# Patient Record
Sex: Female | Born: 1970
Health system: Southern US, Community
[De-identification: ages and names within clinical notes are randomized; demographics above are authoritative.]

## PROBLEM LIST (undated history)

## (undated) DIAGNOSIS — I10 Essential (primary) hypertension: Secondary | ICD-10-CM

## (undated) HISTORY — PX: OOPHORECTOMY: SHX86

## (undated) HISTORY — DX: Essential (primary) hypertension: I10

---

## 2008-05-10 ENCOUNTER — Encounter: Payer: Self-pay | Admitting: Cardiology

## 2008-05-10 ENCOUNTER — Ambulatory Visit: Payer: Self-pay

## 2012-07-04 ENCOUNTER — Encounter (HOSPITAL_COMMUNITY): Payer: Self-pay | Admitting: Emergency Medicine

## 2012-07-04 ENCOUNTER — Emergency Department (HOSPITAL_COMMUNITY)
Admission: EM | Admit: 2012-07-04 | Discharge: 2012-07-05 | Disposition: A | Discharge status: Home / Self Care | Payer: PRIVATE HEALTH INSURANCE | Attending: Emergency Medicine | Admitting: Emergency Medicine

## 2012-07-04 DIAGNOSIS — S0502XA Injury of conjunctiva and corneal abrasion without foreign body, left eye, initial encounter: Secondary | ICD-10-CM

## 2012-07-04 DIAGNOSIS — Y9389 Activity, other specified: Secondary | ICD-10-CM | POA: Insufficient documentation

## 2012-07-04 DIAGNOSIS — S058X9A Other injuries of unspecified eye and orbit, initial encounter: Secondary | ICD-10-CM | POA: Insufficient documentation

## 2012-07-04 DIAGNOSIS — H538 Other visual disturbances: Secondary | ICD-10-CM | POA: Insufficient documentation

## 2012-07-04 DIAGNOSIS — IMO0002 Reserved for concepts with insufficient information to code with codable children: Secondary | ICD-10-CM | POA: Insufficient documentation

## 2012-07-04 DIAGNOSIS — Y9289 Other specified places as the place of occurrence of the external cause: Secondary | ICD-10-CM | POA: Insufficient documentation

## 2012-07-04 DIAGNOSIS — Y990 Civilian activity done for income or pay: Secondary | ICD-10-CM | POA: Insufficient documentation

## 2012-07-04 MED ORDER — TETRACAINE HCL 0.5 % OP SOLN
2.0000 [drp] | Freq: Once | OPHTHALMIC | Status: AC
  Administered 2012-07-04: 2 [drp] via OPHTHALMIC
  Filled 2012-07-04: qty 2

## 2012-07-04 MED ORDER — CYCLOPENTOLATE HCL 1 % OP SOLN
1.0000 [drp] | Freq: Once | OPHTHALMIC | Status: AC
  Administered 2012-07-04: 1 [drp] via OPHTHALMIC

## 2012-07-04 MED ORDER — HYDROCODONE-ACETAMINOPHEN 5-325 MG PO TABS: 1.0000 | ORAL_TABLET | Freq: Four times a day (QID) | ORAL | Status: DC | PRN

## 2012-07-04 MED ORDER — TOBRAMYCIN-DEXAMETHASONE 0.3-0.1 % OP SUSP
2.0000 [drp] | OPHTHALMIC | Status: DC
  Administered 2012-07-04: 2 [drp] via OPHTHALMIC
  Filled 2012-07-04: qty 2.5

## 2012-07-04 NOTE — ED Provider Notes (Signed)
History    This chart was scribed for non-physician practitioner Lottie Mussel, PA-C working with Richardean Canal, MD by Toya Smothers, ED Scribe. This patient was seen in room WTR7/WTR7 and the patient's care was started at 11:20 PM.   CSN: [id number]  Arrival date & time 07/04/12  2255   First MD Initiated Contact with Patient 07/04/12 2312      Chief Complaint  Patient presents with  . Eye Pain   Patient is a 42 y.o. female presenting with eye pain. The history is provided by the patient. No language interpreter was used.  Eye Pain    HPI Comments: Kaitlyn Bennett is a 42 y.o. female who presents to the Emergency Department complaining of left eye pain. Pt works as an Char[employer] fundraiser, and while working she was poked in the eye by a patient's finger. Pain is moderate, dull. Pt now endorses blurred vision. She states that she is concerned because infection because the patient she was "diggin around in his orifices." Symptoms have not been treated PTA. Pt denies headache, diaphoresis, fever, chills, nausea, vomiting, diarrhea, weakness, cough, SOB and any other pain. Pt denies use of tobacco, alcohol, and illicit drug use. Tetanus is up to date. Pt does not wear contacts or has an eye specialist.     History reviewed. No pertinent past medical history.  Past Surgical History  Procedure Laterality Date  . Cesarean section      No family history on file.  History  Substance Use Topics  . Smoking status: Not on file  . Smokeless tobacco: Not on file  . Alcohol Use: Not on file    Review of Systems  Eyes: Positive for pain and visual disturbance. Negative for discharge, redness and itching.    Allergies  Review of patient's allergies indicates not on file.  Home Medications  No current outpatient prescriptions on file.  BP 128/87  Pulse 88  Temp(Src) 98.4 F (36.9 C) (Oral)  Resp 20  SpO2 97%  Physical Exam  Nursing note and vitals reviewed. Constitutional: She is oriented  to person, place, and time. She appears well-developed and well-nourished. No distress.  HENT:  Head: Normocephalic and atraumatic.  Eyes: EOM and lids are normal. Pupils are equal, round, and reactive to light. Left eye exhibits no discharge and no hordeolum. Right conjunctiva is not injected. Left conjunctiva is injected. Left conjunctiva has no hemorrhage.  Slit lamp exam:      The right eye shows no hyphema and no hypopyon.       The left eye shows corneal abrasion. The left eye shows no foreign body, no hyphema and no hypopyon.    Neck: Neck supple. No tracheal deviation present.  Cardiovascular: Normal rate.   Pulmonary/Chest: Effort normal. No respiratory distress.  Musculoskeletal: Normal range of motion. She exhibits no edema.  Neurological: She is alert and oriented to person, place, and time. No sensory deficit.  Skin: Skin is warm and dry.  Psychiatric: She has a normal mood and affect. Her behavior is normal.    ED Course  Procedures  DIAGNOSTIC STUDIES: Oxygen Saturation is 97% on room air, normal by my interpretation.    COORDINATION OF CARE: 23:12- Evaluated Pt. Pt is awake, alert, and without distress. 23:20- Patient understand and agree with initial ED impression and plan with expectations set for ED visit.   Labs Reviewed - No data to display No results found.   1. Corneal abrasion, left, initial encounter  MDM  Pt with left corneal abrasion. Will treat with tobradex and cyclogyl at home. Follow up with ophthalmologist. Pt's tetanus is up to date. No other exam findings. Pt states vision is normal after tetracaine drops. Pain medications at home, norco, and referral to ophthalmology given.   Filed Vitals:   07/04/12 2259  BP: 128/87  Pulse: 88  Temp: 98.4 F (36.9 C)  TempSrc: Oral  Resp: 20  SpO2: 97%     I personally performed the services described in this documentation, which was scribed in my presence. The recorded information has been  reviewed and is accurate.    Lottie Mussel, PA-C 07/05/12 0101

## 2012-07-04 NOTE — ED Notes (Signed)
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  Eye is reddened.  Patient reports vision is a little blurred.

## 2012-07-05 NOTE — ED Provider Notes (Signed)
Medical screening examination/treatment/procedure(s) were performed by non-physician practitioner and as supervising physician I was immediately available for consultation/collaboration.  Keyah Blizard R. Joshua Zeringue, MD 07/05/12 0746 

## 2015-04-03 ENCOUNTER — Ambulatory Visit (INDEPENDENT_AMBULATORY_CARE_PROVIDER_SITE_OTHER): Payer: 59 | Admitting: Physician Assistant

## 2015-04-03 ENCOUNTER — Ambulatory Visit (INDEPENDENT_AMBULATORY_CARE_PROVIDER_SITE_OTHER): Payer: 59

## 2015-04-03 VITALS — BP 120/86 | HR 83 | Temp 97.6°F | Resp 20 | Ht 59.0 in | Wt 145.0 lb

## 2015-04-03 DIAGNOSIS — R0981 Nasal congestion: Secondary | ICD-10-CM | POA: Diagnosis not present

## 2015-04-03 DIAGNOSIS — R05 Cough: Secondary | ICD-10-CM

## 2015-04-03 DIAGNOSIS — R062 Wheezing: Secondary | ICD-10-CM | POA: Diagnosis not present

## 2015-04-03 DIAGNOSIS — R059 Cough, unspecified: Secondary | ICD-10-CM

## 2015-04-03 MED ORDER — IPRATROPIUM BROMIDE 0.02 % IN SOLN
0.5000 mg | Freq: Once | RESPIRATORY_TRACT | Status: AC
  Administered 2015-04-03: 0.5 mg via RESPIRATORY_TRACT

## 2015-04-03 MED ORDER — HYDROCOD POLST-CPM POLST ER 10-8 MG/5ML PO SUER: 5.0000 mL | Freq: Two times a day (BID) | ORAL | Status: DC | PRN

## 2015-04-03 MED ORDER — PREDNISONE 20 MG PO TABS: ORAL_TABLET | ORAL | Status: DC

## 2015-04-03 MED ORDER — ALBUTEROL SULFATE HFA 108 (90 BASE) MCG/ACT IN AERS: 2.0000 | INHALATION_SPRAY | RESPIRATORY_TRACT | Status: DC | PRN

## 2015-04-03 MED ORDER — ALBUTEROL SULFATE (2.5 MG/3ML) 0.083% IN NEBU
2.5000 mg | INHALATION_SOLUTION | Freq: Once | RESPIRATORY_TRACT | Status: AC
  Administered 2015-04-03: 2.5 mg via RESPIRATORY_TRACT

## 2015-04-03 NOTE — Progress Notes (Signed)
Subjective:    Patient ID: Kaitlyn Bennett, female    DOB: [birth year], 45 y.o.   MRN: [id number]  Chief Complaint  Patient presents with  . Cough    x 1 week  . Wheezing  . Nasal Congestion   HPI  Kaitlyn Bennett is a 45 year old woman who presents today with a cough for about 1 week. Reports that last weekend she developed a dry cough, some congestion, body aches, and fever. 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 Dr. Purcell Mouton from [facility] prescribed her Augmentin. She is on day 6 with no improvement in cough or congestion. Body aches and fever have resolved. Cough is non productive but she reports feeling like "there is something stuck down there that she can't get out." States the cough makes her chest tight and is interfering with sleep. Associated with rhinorrhea, congestion, PND, and headache. Started wheezing 3 days ago and feels like she's having lung spasms. She denies sore throat, SOB, and CP. She takes dessolm during the day at work to suppress the cough and then mucionex at night to try to get the mucous out.    Is concerned because her children now have cough. She wants xray, flu swab, and maybe to change to levaquin for better bacterial coverage.   PMH, FH, and SH all reviewed with patient and updated.   No Known Allergies Prior to Admission medications   Medication Sig Start Date End Date Taking? Authorizing Provider  amoxicillin-clavulanate (AUGMENTIN) 875-125 MG tablet Take 1 tablet by mouth 2 (two) times daily.   Yes Historical Provider, MD  B Complex Vitamins (B-COMPLEX/B-12) TABS Take by mouth.   Yes Historical Provider, MD  Calcium Carbonate-Vit D-Min (CALTRATE PLUS PO) Take by mouth.   Yes Historical Provider, MD  Vitamin D, Ergocalciferol, (DRISDOL) 50000 units CAPS capsule Take 50,000 Units by mouth every 7 (seven) days.   Yes Historical Provider, MD   Review of Systems  Constitutional: Positive for  fever and chills. Negative for fatigue.  HENT: Positive for congestion, postnasal drip, rhinorrhea and sinus pressure. Negative for sore throat.   Respiratory: Positive for cough, chest tightness and wheezing. Negative for shortness of breath.   Cardiovascular: Negative for chest pain.  Gastrointestinal: Negative for nausea, vomiting, abdominal pain, diarrhea and constipation.  Neurological: Positive for headaches. Negative for dizziness and light-headedness.      Objective:   Physical Exam  Constitutional: She is oriented to person, place, and time. She appears well-developed and well-nourished.  Blood pressure 120/86, pulse 83, temperature 97.6 F (36.4 C), temperature source Oral, resp. rate 20, height  (1.499 m), weight 145 lb (65.772 kg), last menstrual period 03/23/2015, SpO2 97 %.   HENT:  Head: Normocephalic and atraumatic.  Right Ear: Hearing, tympanic membrane, external ear and ear canal normal.  Left Ear: Hearing, tympanic membrane, external ear and ear canal normal.  Nose: Rhinorrhea present.  Mouth/Throat: Oropharynx is clear and moist and mucous membranes are normal. No oropharyngeal exudate or posterior oropharyngeal edema.  Eyes: Conjunctivae and EOM are normal. Pupils are equal, round, and reactive to light.  Neck: Normal range of motion. Neck supple.  Cardiovascular: Normal rate, regular rhythm, S1 normal, S2 normal and intact distal pulses.  Exam reveals no gallop and no friction rub.   No murmur heard. Pulmonary/Chest: She has no wheezes. She has no rhonchi. She has no rales.  Negative eegophony.  Lymphadenopathy:       Head (right side): No submental, no submandibular, no tonsillar, no preauricular, no posterior auricular and no occipital adenopathy present.       Head (left side): No submental, no submandibular, no tonsillar, no preauricular, no posterior auricular and no occipital adenopathy present.       Right: No supraclavicular adenopathy present.        Left: No supraclavicular adenopathy present.  Neurological: She is alert and oriented to person, place, and time. She has normal strength. No sensory deficit.  Skin: Skin is warm and dry. She is not diaphoretic.  Psychiatric: She has a normal mood and affect. Her behavior is normal. Judgment and thought content normal.   Dg Chest 2 View  04/03/2015  CLINICAL DATA:  45 year old female with cough for 1 week. EXAM: CHEST  2 VIEW COMPARISON:  None. FINDINGS: The cardiomediastinal silhouette is unremarkable. There is no evidence of focal airspace disease, pulmonary edema, suspicious pulmonary nodule/mass, pleural effusion, or pneumothorax. No acute bony abnormalities are identified. IMPRESSION: No active cardiopulmonary disease. Electronically Signed   By: Harmon Pier M.D.   On: 04/03/2015 09:57      Assessment & Plan:   1. Cough Cough is most likely due to PND and bronchial irritation. CXR is clear and indicates no acute infection. Due to the continued nature off her URI will prescribe prednisone to help resolve the inflammation. Will also provide albuterol inhaler to help with wheezing and spasms. Will provide tussionex to help with cough. Patient encouraged to get rest and drink lots of fluids. Instructed to return if symptoms get worse or don't improve. Discussed medications and possible side effects with patient. Patient expressed understanding and agreement with assessment and plan.  - albuterol (PROVENTIL) (2.5 MG/3ML) 0.083% nebulizer solution 2.5 mg; Take 3 mLs (2.5 mg total) by nebulization once. - ipratropium (ATROVENT) nebulizer solution 0.5 mg; Take 2.5 mLs (0.5 mg total) by nebulization once. - DG Chest 2 View; Future - chlorpheniramine-HYDROcodone (TUSSIONEX PENNKINETIC ER) 10-8 MG/5ML SUER; Take 5 mLs by mouth every 12 (twelve) hours as needed for cough.  Dispense: 100 mL; Refill: 0 - albuterol (PROVENTIL HFA;VENTOLIN HFA) 108 (90 Base) MCG/ACT inhaler; Inhale 2 puffs into the lungs  every 4 (four) hours as needed for wheezing or shortness of breath (cough, shortness of breath or wheezing.).  Dispense: 1 Inhaler; Refill: 0 - predniSONE (DELTASONE) 20 MG tablet; Take 3 PO QAM x3days, 2 PO QAM x3days, 1 PO QAM x3days  Dispense: 18 tablet; Refill: 0  S. Lysle Dingwall PA-S [facility]

## 2015-04-03 NOTE — Patient Instructions (Signed)
Use OTC Mucinex to help thin the mucous, make it easier to cough up and spit out, easier to blow it from your nose.  Get plenty of rest and drink at least 64 ounces of water daily.

## 2015-04-03 NOTE — Progress Notes (Signed)
Subjective:   Patient ID: Kaitlyn Bennett, female     DOB: [birth year], 45 y.o.    MRN: [id number]  PCP: No primary care provider on file.  Chief Complaint  Patient presents with  . Cough    x 1 week  . Wheezing  . Nasal Congestion    HPI  Presents for evaluation of a cough for about 1 week.   Reports that last weekend she developed a dry cough, some congestion, body aches, and fever. 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 Dr. Purcell Mouton from [facility] prescribed her Augmentin. She is on day 6 with no improvement in cough or congestion. Body aches and fever have resolved. Cough is non productive but she reports feeling like "there is something stuck down there that she can't get out." States the cough makes her chest tight and is interfering with sleep.   Associated with rhinorrhea, congestion, PND, and headache. Started wheezing 3 days ago and feels like she's having lung spasms. She denies sore throat, SOB, and CP. She takes Delsym during the day at work to suppress the cough and then mucinex at night to try to get the mucous out.   Is concerned because her children now have cough. She wants xray, flu swab, and maybe to change to levaquin for better bacterial coverage. .    Prior to Admission medications   Medication Sig Start Date End Date Taking? Authorizing Provider  amoxicillin-clavulanate (AUGMENTIN) 875-125 MG tablet Take 1 tablet by mouth 2 (two) times daily.   Yes Historical Provider, MD  B Complex Vitamins (B-COMPLEX/B-12) TABS Take by mouth.   Yes Historical Provider, MD  Calcium Carbonate-Vit D-Min (CALTRATE PLUS PO) Take by mouth.   Yes Historical Provider, MD  Vitamin D, Ergocalciferol, (DRISDOL) 50000 units CAPS capsule Take 50,000 Units by mouth every 7 (seven) days.   Yes Historical Provider, MD     No Known Allergies   There are no active problems to display for this patient.    Family  History  Problem Relation Age of Onset  . Hypertension Father      Social History   Social History  . Marital Status: Married    Spouse Name: [name]  . Number of Children: 2  . Years of Education: Nursing   Occupational History  . Nurse [employer]  . Nurse [employer]   Social History Main Topics  . Smoking status: Never Smoker   . Smokeless tobacco: Never Used  . Alcohol Use: No  . Drug Use: No  . Sexual Activity:    Partners: Male    Birth Control/ Protection: None   Other Topics Concern  . Not on file   Social History Narrative   Lives with husband, [name] and two children.         Review of Systems Constitutional: Positive for fever and chills. Negative for fatigue.  HENT: Positive for congestion, postnasal drip, rhinorrhea and sinus pressure. Negative for sore throat.  Respiratory: Positive for cough, chest tightness and wheezing. Negative for shortness of breath.  Cardiovascular: Negative for chest pain.  Gastrointestinal: Negative for nausea, vomiting, abdominal pain, diarrhea and constipation.  Neurological: Positive for headaches. Negative for dizziness and light-headedness.       Objective:  Physical Exam  Constitutional: She is oriented to person, place, and time. She appears well-developed and well-nourished. She is active and cooperative. No distress.  BP 120/86 mmHg  Pulse 83  Temp(Src) 97.6 F (36.4 C) (Oral)  Resp 20  Ht  (1.499 m)  Wt 145 lb (65.772 kg)  BMI 29.27 kg/m2  SpO2 97%  LMP 03/23/2015 Coughs repeatedly during interview and exam   HENT:  Head: Normocephalic and atraumatic.  Right Ear: Hearing, tympanic membrane, external ear and ear canal normal.  Left Ear: Hearing, tympanic membrane, external ear and ear canal normal.  Nose: Mucosal edema and rhinorrhea present.  No foreign bodies. Right sinus exhibits no maxillary sinus tenderness and no frontal sinus tenderness. Left  sinus exhibits no maxillary sinus tenderness and no frontal sinus tenderness.  Mouth/Throat: Uvula is midline, oropharynx is clear and moist and mucous membranes are normal. No uvula swelling. No oropharyngeal exudate.  Eyes: Conjunctivae and EOM are normal. Pupils are equal, round, and reactive to light. Right eye exhibits no discharge. Left eye exhibits no discharge. No scleral icterus.  Neck: Trachea normal, normal range of motion, full passive range of motion without pain and phonation normal. Neck supple. No thyroid mass and no thyromegaly present.  Cardiovascular: Normal rate, regular rhythm, normal heart sounds and intact distal pulses.   Pulmonary/Chest: Effort normal and breath sounds normal.  Lymphadenopathy:       Head (right side): No submandibular, no tonsillar, no preauricular, no posterior auricular and no occipital adenopathy present.       Head (left side): No submandibular, no tonsillar, no preauricular and no occipital adenopathy present.    She has no cervical adenopathy.       Right: No supraclavicular adenopathy present.       Left: No supraclavicular adenopathy present.  Neurological: She is alert and oriented to person, place, and time. She has normal strength. No cranial nerve deficit or sensory deficit.  Skin: Skin is warm, dry and intact. No rash noted.  Psychiatric: She has a normal mood and affect. Her speech is normal and behavior is normal.   Albuterol + Atrovent neb treatment resulted in some modest improvement in cough frequency and chest tightness.  Dg Chest 2 View  04/03/2015  CLINICAL DATA:  45 year old female with cough for 1 week. EXAM: CHEST  2 VIEW COMPARISON:  None. FINDINGS: The cardiomediastinal silhouette is unremarkable. There is no evidence of focal airspace disease, pulmonary edema, suspicious pulmonary nodule/mass, pleural effusion, or pneumothorax. No acute bony abnormalities are identified. IMPRESSION: No active cardiopulmonary disease.  Electronically Signed   By: Harmon Pier M.D.   On: 04/03/2015 09:57             Assessment & Plan:  1. Cough I don't think she has a bacterial infection. She probably did have the flu initially, and the persistent nasal/sinus drainage and bronchospasm are the cause of the cough. Some improvement with the neb in the office. Prednisone taper, Tussionex, albuterol inhaler. Rest. Fluids. OTC Mucinex. - albuterol (PROVENTIL) (2.5 MG/3ML) 0.083% nebulizer solution 2.5 mg; Take 3 mLs (2.5 mg total) by nebulization once. - ipratropium (ATROVENT) nebulizer solution 0.5 mg; Take 2.5 mLs (0.5 mg total) by nebulization once. - DG Chest 2 View; Future - chlorpheniramine-HYDROcodone (TUSSIONEX PENNKINETIC ER) 10-8 MG/5ML SUER; Take 5 mLs by mouth every 12 (twelve) hours as needed for cough.  Dispense: 100 mL; Refill: 0 - albuterol (PROVENTIL HFA;VENTOLIN HFA) 108 (90 Base) MCG/ACT inhaler; Inhale 2 puffs into the lungs every 4 (four) hours as needed for wheezing or shortness of breath (cough, shortness of breath or wheezing.).  Dispense: 1 Inhaler; Refill: 0 -  predniSONE (DELTASONE) 20 MG tablet; Take 3 PO QAM x3days, 2 PO QAM x3days, 1 PO QAM x3days  Dispense: 18 tablet; Refill: 0   Fernande Brashelle S. Emir Nack, PA-C Physician Assistant-Certified [facility]

## 2015-04-21 DIAGNOSIS — Z1231 Encounter for screening mammogram for malignant neoplasm of breast: Secondary | ICD-10-CM | POA: Diagnosis not present

## 2016-01-16 DIAGNOSIS — M62838 Other muscle spasm: Secondary | ICD-10-CM | POA: Diagnosis not present

## 2016-01-16 DIAGNOSIS — Z23 Encounter for immunization: Secondary | ICD-10-CM | POA: Diagnosis not present

## 2016-01-20 ENCOUNTER — Telehealth: Payer: Self-pay | Admitting: Family Medicine

## 2016-01-20 NOTE — Telephone Encounter (Signed)
I have called patient a couple times. One the numbers does not work. The other goes right to VM. I did leave a message for patient to call back and make an appointment.

## 2016-01-23 NOTE — Telephone Encounter (Signed)
Patient called back. She has and appointment in Jan. [facility]

## 2016-02-11 NOTE — Progress Notes (Signed)
Kaitlyn ScaleZach Bennett D.O. [name] [address] [place], [place] [address] Phone: [phone] Subjective:     CC: neck pain  BJY:[id number]:Subjective  Kaitlyn Bennett is a 46 y.o. female coming in with complaint of  Neck pain.  Been going on for many months, no injury rate severity 6/10.  + radicular symptoms down the arm.  Does a lot of lifting. No weakness. Starting to affect daily activities, no nighttime awakening.      No past medical history on file. Past Surgical History:  Procedure Laterality Date  . CESAREAN SECTION     x2  . OOPHORECTOMY     2000   Social History   Social History  . Marital status: Married    Spouse name: [name]  . Number of children: 2  . Years of education: Nursing   Occupational History  . 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 Smoking status: Never Smoker  . Smokeless tobacco: Never Used  . Alcohol use No  . Drug use: No  . Sexual activity: Yes    Partners: Male    Birth control/ protection: None   Other Topics Concern  . None   Social History Narrative   Lives with husband, [name] and two children.    No Known Allergies Family History  Problem Relation Age of Onset  . Hypertension Father     Past medical history, social, surgical and family history all reviewed in electronic medical record.  No pertanent information unless stated regarding to the chief complaint.   Review of Systems:Review of systems updated and as accurate as of 02/13/16  No headache, visual changes, nausea, vomiting, diarrhea, constipation, dizziness, abdominal pain, skin rash, fevers, chills, night sweats, weight loss, swollen lymph nodes, body aches, joint swelling, muscle aches, chest pain, shortness of breath, mood changes.   Objective  Blood pressure 122/82, height 4\' 11"  (1.499 m), weight 139 lb (63 kg). Systems examined below as of 02/13/16   General: No apparent distress  alert and oriented x3 mood and affect normal, dressed appropriately.  HEENT: Pupils equal, extraocular movements intact  Respiratory: Patient's speak in full sentences and does not appear short of breath  Cardiovascular: No lower extremity edema, non tender, no erythema  Skin: Warm dry intact with no signs of infection or rash on extremities or on axial skeleton.  Abdomen: Soft nontender  Neuro: Cranial nerves II through XII are intact, neurovascularly intact in all extremities with 2+ DTRs and 2+ pulses.  Lymph: No lymphadenopathy of posterior or anterior cervical chain or axillae bilaterally.  Gait normal with good balance and coordination.  MSK:  Non tender with full range of motion and good stability and symmetric strength and tone of shoulders, elbows, wrist, hip, knee and ankles bilaterally.  Neck: Inspection unremarkable. No palpable stepoffs. Negative Spurling's maneuver. Mild limitation in left-sided side bending and rotation Grip strength and sensation normal in bilateral hands Strength good C4 to T1 distribution No sensory change to C4 to T1 Negative Hoffman sign bilaterally Reflexes normal Tightness in the left trapezius  Osteopathic findings Cervical C6 flexed rotated and side bent left T3 extended rotated and side bent left inhaled third rib T9 extended rotated and side bent left L2 flexed rotated and side bent right Sacrum right on right    Impression and Recommendations:     This case required medical decision making of moderate complexity.  Note: This dictation was prepared with Dragon dictation along with smaller phrase technology. Any transcriptional errors that result from this process are unintentional.

## 2016-02-13 ENCOUNTER — Ambulatory Visit (INDEPENDENT_AMBULATORY_CARE_PROVIDER_SITE_OTHER): Payer: 59 | Admitting: Family Medicine

## 2016-02-13 ENCOUNTER — Encounter: Payer: Self-pay | Admitting: Family Medicine

## 2016-02-13 VITALS — BP 122/82 | Ht 59.0 in | Wt 139.0 lb

## 2016-02-13 DIAGNOSIS — M999 Biomechanical lesion, unspecified: Secondary | ICD-10-CM | POA: Insufficient documentation

## 2016-02-13 DIAGNOSIS — M940 Chondrocostal junction syndrome [Tietze]: Secondary | ICD-10-CM | POA: Diagnosis not present

## 2016-02-13 DIAGNOSIS — Z86010 Personal history of colonic polyps: Secondary | ICD-10-CM | POA: Diagnosis not present

## 2016-02-13 MED ORDER — GABAPENTIN 100 MG PO CAPS: 200.0000 mg | ORAL_CAPSULE | Freq: Every day | ORAL | 3 refills | Status: DC

## 2016-02-13 NOTE — Patient Instructions (Addendum)
Good to see you  Ice 20 minutes 2 times daily. Usually after activity and before bed. Exercises 3 times a week.  Keep hands within peripheral vision with lifting.  Gabapentin 200mg  at night Can take naproxen if needed.  See me again in 3 weeks.

## 2016-02-13 NOTE — Assessment & Plan Note (Signed)
Slipped rib syndrome  Discussed posture, HEP ergonomics.  Manipulation was done and patient improved.  RTC in 3 weeks.

## 2016-02-13 NOTE — Assessment & Plan Note (Signed)
Decision today to treat with OMT was based on Physical Exam  After verbal consent patient was treated with HVLA, ME, FPR techniques in cervical, thoracic, rib lumbar and sacral areas  Patient tolerated the procedure well with improvement in symptoms  Patient given exercises, stretches and lifestyle modifications  See medications in patient instructions if given  Patient will follow up in 3 weeks 

## 2016-03-04 ENCOUNTER — Other Ambulatory Visit: Payer: Self-pay | Admitting: Physician Assistant

## 2016-03-04 DIAGNOSIS — R05 Cough: Secondary | ICD-10-CM

## 2016-03-04 DIAGNOSIS — R059 Cough, unspecified: Secondary | ICD-10-CM

## 2016-03-05 NOTE — Telephone Encounter (Signed)
Meds ordered this encounter  Medications  . PROVENTIL HFA 108 (90 Base) MCG/ACT inhaler    Sig: INHALE 2 PUFFS INTO THE LUNGS EVERY 4 HOURS AS NEEDED FOR WHEEZING OR SHORTNESS OF BREATH OR COUGH    Dispense:  6.7 g    Refill:  0

## 2016-03-26 ENCOUNTER — Ambulatory Visit (INDEPENDENT_AMBULATORY_CARE_PROVIDER_SITE_OTHER): Payer: 59 | Admitting: Urgent Care

## 2016-03-26 VITALS — BP 140/80 | HR 65 | Temp 98.2°F | Resp 18 | Ht 59.0 in | Wt 139.0 lb

## 2016-03-26 DIAGNOSIS — R05 Cough: Secondary | ICD-10-CM | POA: Diagnosis not present

## 2016-03-26 DIAGNOSIS — R0789 Other chest pain: Secondary | ICD-10-CM | POA: Diagnosis not present

## 2016-03-26 DIAGNOSIS — R0602 Shortness of breath: Secondary | ICD-10-CM | POA: Diagnosis not present

## 2016-03-26 DIAGNOSIS — R059 Cough, unspecified: Secondary | ICD-10-CM

## 2016-03-26 MED ORDER — PREDNISONE 20 MG PO TABS: ORAL_TABLET | ORAL | 0 refills | Status: DC

## 2016-03-26 MED ORDER — AZITHROMYCIN 250 MG PO TABS: ORAL_TABLET | ORAL | 0 refills | Status: DC

## 2016-03-26 MED ORDER — HYDROCODONE-HOMATROPINE 5-1.5 MG/5ML PO SYRP: 5.0000 mL | ORAL_SOLUTION | Freq: Every evening | ORAL | 0 refills | Status: DC | PRN

## 2016-03-26 MED ORDER — BENZONATATE 100 MG PO CAPS: 100.0000 mg | ORAL_CAPSULE | Freq: Three times a day (TID) | ORAL | 0 refills | Status: DC | PRN

## 2016-03-26 MED FILL — BENZONATATE 100 MG CAP: 100 | 10 days supply | Qty: 60 | Fill #0

## 2016-03-26 MED FILL — HYDROCODONE-HOMATROPINE SYR: 5-1.5 | 20 days supply | Qty: 100 | Fill #0

## 2016-03-26 MED FILL — AZITHROMYCIN 250 MG TABLET: 250 | 5 days supply | Qty: 6 | Fill #0

## 2016-03-26 MED FILL — predniSONE 20 MG TABS: 20 | 5 days supply | Qty: 10 | Fill #0

## 2016-03-26 NOTE — Progress Notes (Signed)
  MRN: [id number] DOB: September 14, 1970  Subjective:   Trisha MangleLilibeth L Savidge is a 46 y.o. female presenting for chief complaint of Cough (had flu recently but cough will not go away; dry cough) and Chest Pain (due to cough; feels like chest wall pain)  Reports 2 week history of worsening cough, now causing chest pain and having coughing fits. Has had coughing fits to the point that she gags, elicits shob. Patient started out with symptoms of the flu. She managed this supportively with otc medications including Robitussin, Mucinex. Denies fever. Denies smoking cigarettes. Denies history of asthma. Patient is very worried that she has an infection or PE. Denies being on OCP, long distance travel, recent hospitalizations.  [name] has a current medication list which includes the following prescription(s): aspirin, b-complex/b-12, calcium carbonate-vit d-min, proventil hfa, vitamin d (ergocalciferol), and gabapentin. Also has No Known Allergies.  [name]  has no past medical history on file. Also  has a past surgical history that includes Cesarean section and Oophorectomy.  Objective:   Vitals: BP 140/80   Pulse 65   Temp 98.2 F (36.8 C) (Oral)   Resp 18   Ht 4\' 11"  (1.499 m)   Wt 139 lb (63 kg)   SpO2 99%   BMI 28.07 kg/m   Physical Exam  Constitutional: She is oriented to person, place, and time. She appears well-developed and well-nourished.  HENT:  Mouth/Throat: Oropharynx is clear and moist.  Eyes: No scleral icterus.  Cardiovascular: Normal rate, regular rhythm and intact distal pulses.  Exam reveals no gallop and no friction rub.   No murmur heard. Pulmonary/Chest: No respiratory distress. She has no wheezes. She has no rales.  Neurological: She is alert and oriented to person, place, and time.  Skin: Skin is warm and dry.   Assessment and Plan :   1. Cough 2. Atypical chest pain 3. Shortness of breath - Will cover for infectious process with azithromycin and will also add  steroid course. Cough suppression otherwise. Hold off on chest x-ray for now. RTC if symptoms fail to resolve.   Wallis BambergMario Tymeer Vaquera, PA-C [facility] [phone] 03/26/2016  12:20 PM

## 2016-03-26 NOTE — Patient Instructions (Addendum)
Cough, Adult Coughing is a reflex that clears your throat and your airways. Coughing helps to heal and protect your lungs. It is normal to cough occasionally, but a cough that happens with other symptoms or lasts a long time may be a sign of a condition that needs treatment. A cough may last only 2-3 weeks (acute), or it may last longer than 8 weeks (chronic). What are the causes? Coughing is commonly caused by:  Breathing in substances that irritate your lungs.  A viral or bacterial respiratory infection.  Allergies.  Asthma.  Postnasal drip.  Smoking.  Acid backing up from the stomach into the esophagus (gastroesophageal reflux).  Certain medicines.  Chronic lung problems, including COPD (or rarely, lung cancer).  Other medical conditions such as heart failure. Follow these instructions at home: Pay attention to any changes in your symptoms. Take these actions to help with your discomfort:  Take medicines only as told by your health care provider.  If you were prescribed an antibiotic medicine, take it as told by your health care provider. Do not stop taking the antibiotic even if you start to feel better.  Talk with your health care provider before you take a cough suppressant medicine.  Drink enough fluid to keep your urine clear or pale yellow.  If the air is dry, use a cold steam vaporizer or humidifier in your bedroom or your home to help loosen secretions.  Avoid anything that causes you to cough at work or at home.  If your cough is worse at night, try sleeping in a semi-upright position.  Avoid cigarette smoke. If you smoke, quit smoking. If you need help quitting, ask your health care provider.  Avoid caffeine.  Avoid alcohol.  Rest as needed. Contact a health care provider if:  You have new symptoms.  You cough up pus.  Your cough does not get better after 2-3 weeks, or your cough gets worse.  You cannot control your cough with suppressant medicines  and you are losing sleep.  You develop pain that is getting worse or pain that is not controlled with pain medicines.  You have a fever.  You have unexplained weight loss.  You have night sweats. Get help right away if:  You cough up blood.  You have difficulty breathing.  Your heartbeat is very fast. This information is not intended to replace advice given to you by your health care provider. Make sure you discuss any questions you have with your health care provider. Document Released: 07/21/2010 Document Revised: 06/30/2015 Document Reviewed: 03/31/2014 Elsevier Interactive Patient Education  2017 Elsevier Inc.     IF you received an x-ray today, you will receive an invoice from [facility]. Please contact [facility] at [phone] with questions or concerns regarding your invoice.   IF you received labwork today, you will receive an invoice from [facility]. Please contact [facility] at [phone] with questions or concerns regarding your invoice.   Our billing staff will not be able to assist you with questions regarding bills from these companies.  You will be contacted with the lab results as soon as they are available. The fastest way to get your results is to activate your My Chart account. Instructions are located on the last page of this paperwork. If you have not heard from us regarding the results in 2 weeks, please contact this office.      

## 2016-03-30 ENCOUNTER — Ambulatory Visit (INDEPENDENT_AMBULATORY_CARE_PROVIDER_SITE_OTHER): Payer: 59

## 2016-03-30 ENCOUNTER — Ambulatory Visit (INDEPENDENT_AMBULATORY_CARE_PROVIDER_SITE_OTHER): Payer: 59 | Admitting: Urgent Care

## 2016-03-30 VITALS — BP 114/72 | HR 80 | Temp 98.0°F | Resp 16 | Ht 59.0 in | Wt 141.0 lb

## 2016-03-30 DIAGNOSIS — R079 Chest pain, unspecified: Secondary | ICD-10-CM | POA: Diagnosis not present

## 2016-03-30 DIAGNOSIS — R05 Cough: Secondary | ICD-10-CM | POA: Diagnosis not present

## 2016-03-30 DIAGNOSIS — R0789 Other chest pain: Secondary | ICD-10-CM | POA: Diagnosis not present

## 2016-03-30 DIAGNOSIS — R059 Cough, unspecified: Secondary | ICD-10-CM

## 2016-03-30 MED ORDER — NAPROXEN SODIUM 550 MG PO TABS: 550.0000 mg | ORAL_TABLET | Freq: Two times a day (BID) | ORAL | 1 refills | Status: DC

## 2016-03-30 NOTE — Patient Instructions (Addendum)
Cough, Adult Coughing is a reflex that clears your throat and your airways. Coughing helps to heal and protect your lungs. It is normal to cough occasionally, but a cough that happens with other symptoms or lasts a long time may be a sign of a condition that needs treatment. A cough may last only 2-3 weeks (acute), or it may last longer than 8 weeks (chronic). What are the causes? Coughing is commonly caused by:  Breathing in substances that irritate your lungs.  A viral or bacterial respiratory infection.  Allergies.  Asthma.  Postnasal drip.  Smoking.  Acid backing up from the stomach into the esophagus (gastroesophageal reflux).  Certain medicines.  Chronic lung problems, including COPD (or rarely, lung cancer).  Other medical conditions such as heart failure. Follow these instructions at home: Pay attention to any changes in your symptoms. Take these actions to help with your discomfort:  Take medicines only as told by your health care provider.  If you were prescribed an antibiotic medicine, take it as told by your health care provider. Do not stop taking the antibiotic even if you start to feel better.  Talk with your health care provider before you take a cough suppressant medicine.  Drink enough fluid to keep your urine clear or pale yellow.  If the air is dry, use a cold steam vaporizer or humidifier in your bedroom or your home to help loosen secretions.  Avoid anything that causes you to cough at work or at home.  If your cough is worse at night, try sleeping in a semi-upright position.  Avoid cigarette smoke. If you smoke, quit smoking. If you need help quitting, ask your health care provider.  Avoid caffeine.  Avoid alcohol.  Rest as needed. Contact a health care provider if:  You have new symptoms.  You cough up pus.  Your cough does not get better after 2-3 weeks, or your cough gets worse.  You cannot control your cough with suppressant medicines  and you are losing sleep.  You develop pain that is getting worse or pain that is not controlled with pain medicines.  You have a fever.  You have unexplained weight loss.  You have night sweats. Get help right away if:  You cough up blood.  You have difficulty breathing.  Your heartbeat is very fast. This information is not intended to replace advice given to you by your health care provider. Make sure you discuss any questions you have with your health care provider. Document Released: 07/21/2010 Document Revised: 06/30/2015 Document Reviewed: 03/31/2014 Elsevier Interactive Patient Education  2017 Elsevier Inc.     IF you received an x-ray today, you will receive an invoice from [facility]. Please contact [facility] at [phone] with questions or concerns regarding your invoice.   IF you received labwork today, you will receive an invoice from [facility]. Please contact [facility] at [phone] with questions or concerns regarding your invoice.   Our billing staff will not be able to assist you with questions regarding bills from these companies.  You will be contacted with the lab results as soon as they are available. The fastest way to get your results is to activate your My Chart account. Instructions are located on the last page of this paperwork. If you have not heard from us regarding the results in 2 weeks, please contact this office.      

## 2016-03-30 NOTE — Progress Notes (Signed)
    MRN: [id number] DOB: 03-27-1970  Subjective:   Kaitlyn Bennett is a 46 y.o. female presenting for follow up on her cough, atypical chest pain. She was last seen on 03/26/2016, started on azithromycin, cough suppression, short steroid course. She notes improvement in her cough, chest congestion but still has a cough. Her cough is causing left sided rib pain. Denies fever, shob, wheezing, n/v, abdominal pain, sore throat. She has used Alleve with some relief.  Kaitlyn Bennett has a current medication list which includes the following prescription(s): aspirin, azithromycin, b-complex/b-12, benzonatate, calcium carbonate-vit d-min, gabapentin, hydrocodone-homatropine, prednisone, proventil hfa, and vitamin d (ergocalciferol). Also has No Known Allergies.  Kaitlyn Bennett denies past medical history. Also  has a past surgical history that includes Cesarean section and Oophorectomy.  Objective:   Vitals: BP 114/72 (BP Location: Right Arm, Patient Position: Sitting, Cuff Size: Small)   Pulse 80   Temp 98 F (36.7 C) (Oral)   Resp 16   Ht 4\' 11"  (1.499 m)   Wt 141 lb (64 kg)   LMP 03/24/2016   SpO2 98%   BMI 28.48 kg/m   Physical Exam  Constitutional: She is oriented to person, place, and time. She appears well-developed and well-nourished.  Cardiovascular: Normal rate, regular rhythm and intact distal pulses.  Exam reveals no gallop and no friction rub.   No murmur heard. Pulmonary/Chest: Effort normal. No respiratory distress. She has no wheezes. She has no rales. She exhibits tenderness (reproducible, left-sided).  Neurological: She is alert and oriented to person, place, and time.   Dg Chest 2 View  Result Date: 03/30/2016 CLINICAL DATA:  Chest pain with cough EXAM: CHEST  2 VIEW COMPARISON:  04/03/2015 FINDINGS: The heart size and mediastinal contours are within normal limits. Both lungs are clear. The visualized skeletal structures are unremarkable. IMPRESSION: No active cardiopulmonary  disease. Electronically Signed   By: Jasmine PangKim  Fujinaga M.D.   On: 03/30/2016 18:45   Assessment and Plan :   1. Cough 2. Atypical chest pain - Pain is reproducible, musculoskeletal and likely secondary to her coughing fits. Will offer NSAID, continue cough suppression. RTC if chest pain or cough persist.  Wallis BambergMario Alayssa Flinchum, PA-C [facility] [phone] 03/30/2016 6:14 PM

## 2016-05-22 DIAGNOSIS — Z1231 Encounter for screening mammogram for malignant neoplasm of breast: Secondary | ICD-10-CM | POA: Diagnosis not present

## 2016-06-28 DIAGNOSIS — Z1151 Encounter for screening for human papillomavirus (HPV): Secondary | ICD-10-CM | POA: Diagnosis not present

## 2016-06-28 DIAGNOSIS — Z130 Encounter for screening for diseases of the blood and blood-forming organs and certain disorders involving the immune mechanism: Secondary | ICD-10-CM | POA: Diagnosis not present

## 2016-06-28 DIAGNOSIS — Z131 Encounter for screening for diabetes mellitus: Secondary | ICD-10-CM | POA: Diagnosis not present

## 2016-06-28 DIAGNOSIS — Z1321 Encounter for screening for nutritional disorder: Secondary | ICD-10-CM | POA: Diagnosis not present

## 2016-06-28 DIAGNOSIS — Z0000 Encounter for general adult medical examination without abnormal findings: Secondary | ICD-10-CM | POA: Diagnosis not present

## 2016-06-28 DIAGNOSIS — Z01419 Encounter for gynecological examination (general) (routine) without abnormal findings: Secondary | ICD-10-CM | POA: Diagnosis not present

## 2016-06-28 DIAGNOSIS — Z13220 Encounter for screening for lipoid disorders: Secondary | ICD-10-CM | POA: Diagnosis not present

## 2016-06-28 DIAGNOSIS — Z1329 Encounter for screening for other suspected endocrine disorder: Secondary | ICD-10-CM | POA: Diagnosis not present

## 2016-06-28 DIAGNOSIS — Z6827 Body mass index (BMI) 27.0-27.9, adult: Secondary | ICD-10-CM | POA: Diagnosis not present

## 2017-01-22 DIAGNOSIS — I10 Essential (primary) hypertension: Secondary | ICD-10-CM | POA: Diagnosis not present

## 2017-02-21 DIAGNOSIS — I10 Essential (primary) hypertension: Secondary | ICD-10-CM | POA: Diagnosis not present

## 2017-02-21 DIAGNOSIS — Z6826 Body mass index (BMI) 26.0-26.9, adult: Secondary | ICD-10-CM | POA: Diagnosis not present

## 2017-02-21 DIAGNOSIS — E663 Overweight: Secondary | ICD-10-CM | POA: Diagnosis not present

## 2017-03-04 MED FILL — AMLODIPINE BESYLATE 5 MG TA: 5 | 30 days supply | Qty: 30 | Fill #0

## 2017-05-30 DIAGNOSIS — Z1231 Encounter for screening mammogram for malignant neoplasm of breast: Secondary | ICD-10-CM | POA: Diagnosis not present

## 2017-05-31 DIAGNOSIS — F418 Other specified anxiety disorders: Secondary | ICD-10-CM | POA: Diagnosis not present

## 2017-05-31 DIAGNOSIS — I10 Essential (primary) hypertension: Secondary | ICD-10-CM | POA: Diagnosis not present

## 2017-05-31 DIAGNOSIS — R7301 Impaired fasting glucose: Secondary | ICD-10-CM | POA: Diagnosis not present

## 2017-05-31 DIAGNOSIS — R0789 Other chest pain: Secondary | ICD-10-CM | POA: Diagnosis not present

## 2017-05-31 DIAGNOSIS — H9313 Tinnitus, bilateral: Secondary | ICD-10-CM | POA: Diagnosis not present

## 2017-06-05 MED FILL — AMOXICILLIN 500 MG CAPSULE: 500 | 7 days supply | Qty: 28 | Fill #0

## 2017-07-11 DIAGNOSIS — N644 Mastodynia: Secondary | ICD-10-CM | POA: Diagnosis not present

## 2017-07-11 DIAGNOSIS — N6320 Unspecified lump in the left breast, unspecified quadrant: Secondary | ICD-10-CM | POA: Diagnosis not present

## 2017-07-25 DIAGNOSIS — N6002 Solitary cyst of left breast: Secondary | ICD-10-CM | POA: Diagnosis not present

## 2017-07-25 DIAGNOSIS — N644 Mastodynia: Secondary | ICD-10-CM | POA: Diagnosis not present

## 2017-08-27 DIAGNOSIS — Z01419 Encounter for gynecological examination (general) (routine) without abnormal findings: Secondary | ICD-10-CM | POA: Diagnosis not present

## 2017-08-27 DIAGNOSIS — Z6827 Body mass index (BMI) 27.0-27.9, adult: Secondary | ICD-10-CM | POA: Diagnosis not present

## 2018-08-22 ENCOUNTER — Encounter: Payer: Self-pay | Admitting: Family Medicine

## 2018-08-22 ENCOUNTER — Ambulatory Visit: Payer: 59 | Admitting: Family Medicine

## 2018-08-22 VITALS — BP 120/76 | HR 100 | Ht 59.0 in | Wt 141.0 lb

## 2018-08-22 DIAGNOSIS — S86111A Strain of other muscle(s) and tendon(s) of posterior muscle group at lower leg level, right leg, initial encounter: Secondary | ICD-10-CM | POA: Diagnosis not present

## 2018-08-22 NOTE — Patient Instructions (Signed)
Medial Head Gastrocnemius Tear  Medial head gastrocnemius tear, also called tennis leg, is an injury to the inner part of the calf muscle. This injury may include overstretching of the muscle or a partial or complete tear. This is a common sports injury. Calf muscle tears usually occur near the back of the knee. This often causes sudden pain and muscle weakness. What are the causes? This condition is caused by forceful stretching or strain on the calf muscle. This usually happens when you forcefully push off of your foot. It may also happen if you forcefully straighten your knee while your foot is flat on the ground. What increases the risk? The following factors may make you more likely to develop this condition:  Being female and older than age 40.  Playing sports that involve: ? Quick increases in speed and changes of direction, such as tennis and soccer. ? Jumping, such as basketball. ? Running, especially uphill or on uneven ground. What are the signs or symptoms? Symptoms of this condition include:  Sudden pain in the back of the leg. You may hear a noise, like a pop or a snap at the time of injury.  Pain that gets worse when you bring your toes up toward your shin or when you straighten your knee.  Pain on the inside of your calf, from your knee to your ankle.  Pain when pressing on your calf muscle.  Swelling and bruising along your calf and lower leg, down to your ankle. This may worsen for the first 2 days before getting better.  Not being able to rise up on your toes.  Difficulty pushing off your foot when walking or using stairs. How is this diagnosed? This condition may be diagnosed based on:  Your symptoms and medical history.  A physical exam. Your health care provider may be able to feel a lump or a defect in your muscle.  An MRI or ultrasound to determine the severity and exact location of your injury. How is this treated? Treatment for this condition may  include:  Resting the muscle and keeping weight off your leg for several days. During this time, you may use crutches or another walking device.  Using a splint to keep your ankle or knee in a stable position.  Wearing a walking boot to decrease the use of your gastrocnemius muscle.  Using a wedge under your heel to reduce stretching of your healing muscle.  Wearing a compression sleeve around your calf muscle.  Icing the muscle.  Raising (elevating) your leg when resting.  Taking medicine for pain and swelling, such as NSAIDs or steroids.  Taking medicine for muscle spasms.  Doing leg exercises as told by your health care provider or physical therapist. Follow these instructions at home: Medicines  Take over-the-counter and prescription medicines only as told by your health care provider.  Ask your health care provider if the medicine prescribed to you requires you to avoid driving or using heavy machinery.  Talk with your health care provider before you take any medicines that contain aspirin. Aspirin increases your risk for bleeding at the injured area. If you have a splint, boot, or compression sleeve:  Wear it as told by your health care provider. Remove it only as told by your health care provider.  Loosen the splint, boot, or sleeve if your toes tingle, become numb, or turn cold and blue.  Keep the splint, boot, or sleeve clean.  If the splint, boot, or sleeve is not   waterproof: ? Do not let it get wet. ? Cover it with a watertight covering when you take a bath or shower. Managing pain, stiffness, and swelling   If directed, put ice on the injured area. ? If you have a removable splint, boot, or sleeve, remove it as told by your health care provider. ? Put ice in a plastic bag. ? Place a towel between your skin and the bag. ? Leave the ice on for 20 minutes, 2-3 times a day.  Move your toes often to reduce stiffness and swelling.  Elevate the injured area  above the level of your heart while you are sitting or lying down. Activity  Return gradually to your normal activities as told by your health care provider. Ask your health care provider what activities are safe for you.  Do not use the injured limb to support your body weight until your health care provider says that you can. Use crutches as told by your health care provider.  Do exercises as told by your health care provider.  Return to sporting activity only as told by your health care provider or physical therapist. Full recovery may take several months. General instructions  Ask your health care provider when it is safe to drive.  Do not use any products that contain nicotine or tobacco, such as cigarettes, e-cigarettes, and chewing tobacco. If you need help quitting, ask your health care provider.  Keep all follow-up visits as told by your health care provider. This is important. How is this prevented?  Warm up and stretch before being active.  Cool down and stretch after being active.  Give your body time to rest between periods of activity.  Make sure to use equipment that fits you.  Be safe and responsible while being active to avoid falls.  Maintain physical fitness, including: ? Strength. ? Flexibility. Contact a health care provider if:  Your symptoms do not improve with rest and treatment. Get help right away if:  You have swelling or redness in your calf that is getting worse.  Your skin or toenails turn blue or gray, feel cold, or become numb. Summary  Medial head gastrocnemius tear, also called tennis leg, is an injury to the inner part of the calf muscle.  Follow instructions as told by your health care provider for resting, icing, compressing, and elevating your leg.  Take over-the-counter and prescription medicines only as told by your health care provider.  Contact a health care provider if your symptoms do not improve with rest and treatment.  This information is not intended to replace advice given to you by your health care provider. Make sure you discuss any questions you have with your health care provider. Document Released: 01/22/2005 Document Revised: 12/18/2017 Document Reviewed: 12/18/2017 Elsevier Patient Education  2020 [employer]  

## 2018-08-22 NOTE — Progress Notes (Signed)
Established Patient Office Visit  Subjective:  Patient ID: Kaitlyn Bennett, female    DOB: 1970/08/23  Age: 48 y.o. MRN: [id number]  CC:  Chief Complaint  Patient presents with  . lower leg bruised    HPI Kaitlyn Bennett presents for evaluation of an injury to her right leg that happened a week ago.  Her mother had started to fall and the patient arose quickly and started to spread towards her mother.  She felt a pull in her right leg.  Since that time there is been bruising and ecchymosis in the lower part of her leg and around the ankle.  She has been treating with rest elevation and ibuprofen.  No prior history of an Achilles injury.  Patient works as an Health visitor at [employer].  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 History reviewed. No pertinent past medical history.  Past Surgical History:  Procedure Laterality Date  . CESAREAN SECTION     x2  . OOPHORECTOMY     2000    Family History  Problem Relation Age of Onset  . Hypertension Father     Social History   Socioeconomic History  . Marital status: Married    Spouse name: [name]  . Number of children: 2  . Years of education: Nursing  . Highest education level: Not on file  Occupational History  . Occupation: Optician, dispensing: [place]  . Occupation: Optician, dispensing: [employer]  Social Needs  . Financial resource strain: Not on file  . Food insecurity    Worry: Not on file    Inability: Not on file  . Transportation needs    Medical: Not on file    Non-medical: Not on file  Tobacco Use  . Smoking status: Never Smoker  . Smokeless tobacco: Never Used  Substance and Sexual Activity  . Alcohol use: No    Alcohol/week: 0.0 standard drinks  . Drug use: No  . Sexual activity: Yes    Partners: Male    Birth control/protection: None  Lifestyle  . Physical activity    Days per week: Not on file    Minutes per session: Not on file  .  Stress: Not on file  Relationships  . Social Herbalist on phone: Not on file    Gets together: Not on file    Attends religious service: Not on file    Active member of club or organization: Not on file    Attends meetings of clubs or organizations: Not on file    Relationship status: Not on file  . Intimate partner violence    Fear of current or ex partner: Not on file    Emotionally abused: Not on file    Physically abused: Not on file    Forced sexual activity: Not on file  Other Topics Concern  . Not on file  Social History Narrative   Lives with husband, [name] and two children.     Outpatient Medications Prior to Visit  Medication Sig Dispense Refill  . Ascorbic Acid (VITAMIN C PO) Take by mouth.    . B Complex Vitamins (B-COMPLEX/B-12) TABS Take by mouth.    . Calcium Carbonate-Vit D-Min (CALTRATE PLUS PO) Take by mouth.    . TURMERIC PO Take by mouth.    Marland Kitchen aspirin 81 MG tablet Take 81 mg by mouth daily.    Marland Kitchen azithromycin (  ZITHROMAX) 250 MG tablet Start with 2 tablets today, then 1 daily thereafter. 6 tablet 0  . benzonatate (TESSALON) 100 MG capsule Take 1-2 capsules (100-200 mg total) by mouth 3 (three) times daily as needed. 60 capsule 0  . gabapentin (NEURONTIN) 100 MG capsule Take 2 capsules (200 mg total) by mouth at bedtime. 60 capsule 3  . HYDROcodone-homatropine (HYCODAN) 5-1.5 MG/5ML syrup Take 5 mLs by mouth at bedtime as needed. 100 mL 0  . naproxen sodium (ANAPROX DS) 550 MG tablet Take 1 tablet (550 mg total) by mouth 2 (two) times daily with a meal. 30 tablet 1  . predniSONE (DELTASONE) 20 MG tablet Take 2 tablets daily with breakfast. 10 tablet 0  . PROVENTIL HFA 108 (90 Base) MCG/ACT inhaler INHALE 2 PUFFS INTO THE LUNGS EVERY 4 HOURS AS NEEDED FOR WHEEZING OR SHORTNESS OF BREATH OR COUGH 6.7 g 0  . Vitamin D, Ergocalciferol, (DRISDOL) 50000 units CAPS capsule Take 50,000 Units by mouth every 7 (seven) days.     No facility-administered  medications prior to visit.     No Known Allergies  ROS Review of Systems  Constitutional: Negative.   Respiratory: Negative.   Cardiovascular: Negative.   Gastrointestinal: Negative.   Musculoskeletal: Positive for gait problem, joint swelling and myalgias.  Neurological: Negative for weakness and numbness.  Hematological: Does not bruise/bleed easily.  Psychiatric/Behavioral: Negative.       Objective:    Physical Exam  Constitutional: She is oriented to person, place, and time. She appears well-developed and well-nourished. No distress.  HENT:  Head: Normocephalic and atraumatic.  Right Ear: External ear normal.  Left Ear: External ear normal.  Eyes: Right eye exhibits no discharge. Left eye exhibits no discharge. No scleral icterus.  Neck: No JVD present. No tracheal deviation present.  Pulmonary/Chest: Effort normal.  Musculoskeletal:     Right lower leg: She exhibits tenderness, swelling and deformity. She exhibits no bony tenderness and no laceration. No edema.       Legs:  Neurological: She is alert and oriented to person, place, and time.  Skin: Skin is warm and dry. She is not diaphoretic.  Psychiatric: She has a normal mood and affect. Her behavior is normal.    BP 120/76   Pulse 100   Ht '4\' 11"'  (1.499 m)   Wt 141 lb (64 kg)   SpO2 98%   BMI 28.48 kg/m  Wt Readings from Last 3 Encounters:  08/22/18 141 lb (64 kg)  03/30/16 141 lb (64 kg)  03/26/16 139 lb (63 kg)   BP Readings from Last 3 Encounters:  08/22/18 120/76  03/30/16 114/72  03/26/16 140/80   Guideline developer:  UpToDate (see UpToDate for funding source) Date Released: June 2014  Health Maintenance Due  Topic Date Due  . HIV Screening  04/15/1986  . TETANUS/TDAP  04/15/1990  . PAP SMEAR-Modifier  04/14/1992    There are no preventive care reminders to display for this patient.  No results found for: TSH No results found for: WBC, HGB, HCT, MCV, PLT No results found for: NA, K,  CHLORIDE, CO2, GLUCOSE, BUN, CREATININE, BILITOT, ALKPHOS, AST, ALT, PROT, ALBUMIN, CALCIUM, ANIONGAP, EGFR, GFR No results found for: CHOL No results found for: HDL No results found for: LDLCALC No results found for: TRIG No results found for: CHOLHDL No results found for: HGBA1C    Assessment & Plan:   Problem List Items Addressed This Visit      Musculoskeletal and Integument  Gastrocnemius tear, right, initial encounter - Primary   Relevant Orders   Ambulatory referral to Sports Medicine      No orders of the defined types were placed in this encounter.   Follow-up: No follow-ups on file.    Patient was given information on gastroc tear.  Fortunately she is off of work for another week.  Sports medicine referral for possible ultrasound evaluation.  Continue ibuprofen as needed.  Avoid prolonged ambulation.

## 2018-08-26 ENCOUNTER — Ambulatory Visit: Payer: 59 | Admitting: Sports Medicine

## 2018-08-27 ENCOUNTER — Telehealth: Payer: Self-pay

## 2018-08-27 NOTE — Telephone Encounter (Signed)
Questions for Screening COVID-19  Symptom onset:n/a  Travel or Contacts: no  During this illness, did/does the patient experience any of the following symptoms? Fever >100.10F []   Yes [x]   No []   Unknown Subjective fever (felt feverish) []   Yes [x]   No []   Unknown Chills []   Yes [x]   No []   Unknown Muscle aches (myalgia) []   Yes [x]   No []   Unknown Runny nose (rhinorrhea) []   Yes [x]   No []   Unknown Sore throat []   Yes [x]   No []   Unknown Cough (new onset or worsening of chronic cough) []   Yes [x]   No []   Unknown Shortness of breath (dyspnea) []   Yes [x]   No []   Unknown Nausea or vomiting []   Yes [x]   No []   Unknown Headache []   Yes [x]   No []   Unknown Abdominal pain  []   Yes [x]   No []   Unknown Diarrhea (?3 loose/looser than normal stools/24hr period) []   Yes [x]   No []   Unknown Other, specify:  Patient risk factors: Smoker? []   Current []   Former []   Never If female, currently pregnant? []   Yes []   No  Patient Active Problem List   Diagnosis Date Noted  . Gastrocnemius tear, right, initial encounter 08/22/2018  . 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 Nonallopathic lesion of cervical region 02/13/2016  . Nonallopathic lesion of thoracic region 02/13/2016  . Nonallopathic lesion of rib cage 02/13/2016    Plan:  []   High risk for COVID-19 with red flags go to ED (with CP, SOB, weak/lightheaded, or fever > 101.5). Call ahead.  []   High risk for COVID-19 but stable. Inform provider and coordinate time for [facility] visit.   []   No red flags but URI signs or symptoms okay for [facility] visit.

## 2018-08-28 ENCOUNTER — Encounter: Payer: Self-pay | Admitting: Family Medicine

## 2018-08-28 ENCOUNTER — Ambulatory Visit (INDEPENDENT_AMBULATORY_CARE_PROVIDER_SITE_OTHER): Payer: 59 | Admitting: Family Medicine

## 2018-08-28 ENCOUNTER — Other Ambulatory Visit: Payer: Self-pay

## 2018-08-28 VITALS — BP 136/82 | HR 76 | Temp 98.4°F | Ht 59.0 in | Wt 141.0 lb

## 2018-08-28 DIAGNOSIS — R7301 Impaired fasting glucose: Secondary | ICD-10-CM

## 2018-08-28 DIAGNOSIS — Z0000 Encounter for general adult medical examination without abnormal findings: Secondary | ICD-10-CM | POA: Diagnosis not present

## 2018-08-28 DIAGNOSIS — M542 Cervicalgia: Secondary | ICD-10-CM | POA: Diagnosis not present

## 2018-08-28 DIAGNOSIS — G8929 Other chronic pain: Secondary | ICD-10-CM | POA: Diagnosis not present

## 2018-08-28 DIAGNOSIS — Z205 Contact with and (suspected) exposure to viral hepatitis: Secondary | ICD-10-CM | POA: Diagnosis not present

## 2018-08-28 DIAGNOSIS — M545 Low back pain, unspecified: Secondary | ICD-10-CM

## 2018-08-28 LAB — BASIC METABOLIC PANEL WITH GFR
BUN: 14 mg/dL (ref 6–23)
CO2: 27 meq/L (ref 19–32)
Calcium: 9.3 mg/dL (ref 8.4–10.5)
Chloride: 102 meq/L (ref 96–112)
Creatinine, Ser: 0.69 mg/dL (ref 0.40–1.20)
GFR: 91.05 mL/min
Glucose, Bld: 96 mg/dL (ref 70–99)
Potassium: 4 meq/L (ref 3.5–5.1)
Sodium: 137 meq/L (ref 135–145)

## 2018-08-28 LAB — LIPID PANEL
Cholesterol: 182 mg/dL (ref 0–200)
HDL: 56.9 mg/dL
LDL Cholesterol: 111 mg/dL — ABNORMAL HIGH (ref 0–99)
NonHDL: 124.87
Total CHOL/HDL Ratio: 3
Triglycerides: 67 mg/dL (ref 0.0–149.0)
VLDL: 13.4 mg/dL (ref 0.0–40.0)

## 2018-08-28 LAB — HEMOGLOBIN A1C: Hgb A1c MFr Bld: 5.7 % (ref 4.6–6.5)

## 2018-08-28 LAB — AST: AST: 14 U/L (ref 0–37)

## 2018-08-28 LAB — ALT: ALT: 19 U/L (ref 0–35)

## 2018-08-28 LAB — VITAMIN D 25 HYDROXY (VIT D DEFICIENCY, FRACTURES): VITD: 31.74 ng/mL (ref 30.00–100.00)

## 2018-08-28 NOTE — Progress Notes (Signed)
Kaitlyn Bennett is a 48 y.o. female  Chief Complaint  Patient presents with  . Establish Care    est care/ CPE-fasting/ has OBGYN-last pap 3 years?/ wants to be checked for hep C mom dx and she is her care giver    HPI: Kaitlyn Bennett is a 48 y.o. female here to establish care with our office and for annual CPE, fasting labs. 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 Pt states her A1C was elevated 2 years ago but hasn't been checked since. Pt is caring for her mother with stage IV cholangiocarcinoma who was also diagnosed with Hep A and ? Hep C. Pt would like to be checked for hepatitis.   Pt notes a h/o chronic Lt sided neck pain as well as Lt low back pain. She saw Dr. Tamala Julian in 2018 for this and she states PT was recommended but she did not do this. She also didn't take the gabapentin that was Rx'd. She would like new PT referral and plans to call to schedule f/u with Dr. Tamala Julian.  Last PAP: 3 years ago - follows with Gyn Dr. Valentino Saxon Last mammo: 06/2017   No past medical history on file.  Past Surgical History:  Procedure Laterality Date  . CESAREAN SECTION     x2  . OOPHORECTOMY     2000    Social History   Socioeconomic History  . Marital status: Married    Spouse name: [name]  . Number of children: 2  . Years of education: Nursing  . Highest education level: Not on file  Occupational History  . Occupation: Optician, dispensing: [place]  . Occupation: Optician, dispensing: [employer]  Social Needs  . Financial resource strain: Not on file  . Food insecurity    Worry: Not on file    Inability: Not on file  . Transportation needs    Medical: Not on file    Non-medical: Not on file  Tobacco Use  . Smoking status: Never Smoker  . Smokeless tobacco: Never Used  Substance and Sexual Activity  . Alcohol use: No    Alcohol/week: 0.0 standard drinks  . Drug use: No  . Sexual activity: Yes    Partners: Male     Birth control/protection: None  Lifestyle  . Physical activity    Days per week: Not on file    Minutes per session: Not on file  . Stress: Not on file  Relationships  . Social Herbalist on phone: Not on file    Gets together: Not on file    Attends religious service: Not on file    Active member of club or organization: Not on file    Attends meetings of clubs or organizations: Not on file    Relationship status: Not on file  . Intimate partner violence    Fear of current or ex partner: Not on file    Emotionally abused: Not on file    Physically abused: Not on file    Forced sexual activity: Not on file  Other Topics Concern  . Not on file  Social History Narrative   Lives with husband, [name] and two children.     Family History  Problem Relation Age of Onset  . Hypertension Father      Immunization History  Administered Date(s) Administered  . Influenza-Unspecified 10/07/2015  . Tdap 10/07/2010  Outpatient Encounter Medications as of 08/28/2018  Medication Sig  . Ascorbic Acid (VITAMIN C PO) Take by mouth.  . B Complex Vitamins (B-COMPLEX/B-12) TABS Take by mouth.  . TURMERIC PO Take by mouth.  . Calcium Carbonate-Vit D-Min (CALTRATE PLUS PO) Take by mouth.   No facility-administered encounter medications on file as of 08/28/2018.      ROS: Gen: no fever, chills  Skin: no rash, itching ENT: no ear pain, ear drainage, nasal congestion, rhinorrhea, sinus pressure, sore throat Eyes: no blurry vision, double vision Resp: no cough, wheeze,SOB Breast: no breast tenderness, no nipple discharge, no breast masses CV: no CP, palpitations, LE edema,  GI: no heartburn, n/v/d/c, abd pain GU: no dysuria, urgency, frequency, hematuria; no vaginal itching, odor, discharge MSK: as above in HPI Neuro: no dizziness, headache, weakness Psych: no depression, anxiety, insomnia   No Known Allergies  BP 136/82   Pulse 76   Temp 98.4 F (36.9 C)  (Oral)   Ht 4\' 11"  (1.499 m)   Wt 141 lb (64 kg)   SpO2 98%   BMI 28.48 kg/m   Physical Exam  Constitutional: She is oriented to person, place, and time. She appears well-developed and well-nourished. No distress.  HENT:  Head: Normocephalic and atraumatic.  Right Ear: Tympanic membrane and ear canal normal.  Left Ear: Tympanic membrane and ear canal normal.  Nose: Nose normal.  Mouth/Throat: Oropharynx is clear and moist and mucous membranes are normal.  Eyes: Pupils are equal, round, and reactive to light. Conjunctivae are normal.  Neck: Neck supple. No thyromegaly present.  Cardiovascular: Normal rate, regular rhythm, normal heart sounds and intact distal pulses.  No murmur heard. Pulmonary/Chest: Effort normal and breath sounds normal. No respiratory distress. She has no wheezes. She has no rhonchi.  Abdominal: Soft. Bowel sounds are normal. She exhibits no distension and no mass. There is no abdominal tenderness.  Musculoskeletal:        General: No edema.  Lymphadenopathy:    She has no cervical adenopathy.  Neurological: She is alert and oriented to person, place, and time. She exhibits normal muscle tone. Coordination normal.  Skin: Skin is warm and dry.  Psychiatric: She has a normal mood and affect. Her behavior is normal.     A/P:  1. Annual physical exam - UTD on immunizations - UTD on dental and vision exams - mammo scheduled for next week, PAP due in 10/2018 and she has appt with GYN - stressed importance of healthy diet and regular CV exercise - ALT - AST - Basic metabolic panel - Lipid panel - VITAMIN D 25 Hydroxy (Vit-D Deficiency, Fractures) - Hepatitis, Acute - Hemoglobin A1c - next CPE in 1 year  2. Exposure to hepatitis - pt caring for mom who is on home hospice with dx of stage IV cholangiocarcinoma. Pt states mom was diagnosed with Hep A and possibly Hep C and pt is requesting testing for herself. She is asymptomatic. - Hepatitis, Acute  3.  Elevated fasting blood sugar - Hemoglobin A1c  4. Neck pain, chronic 5. Chronic left-sided low back pain without sciatica - Ambulatory referral to Physical Therapy - pt plans to call to schedule f/u appt with Dr. Katrinka BlazingSmith

## 2018-08-29 ENCOUNTER — Encounter: Payer: Self-pay | Admitting: Family Medicine

## 2018-08-29 LAB — HEPATITIS PANEL, ACUTE
Hep A IgM: NONREACTIVE
Hep B C IgM: NONREACTIVE
Hepatitis B Surface Ag: NONREACTIVE
Hepatitis C Ab: NONREACTIVE
SIGNAL TO CUT-OFF: 0.02 (ref <1.00)

## 2018-11-28 IMAGING — DX DG CHEST 2V
2 series · 2 of 2 positions shown · non-contrast
Comparison: 04/03/2015

CLINICAL DATA: Chest pain with cough

EXAM:
CHEST  2 VIEW

[chest pa]
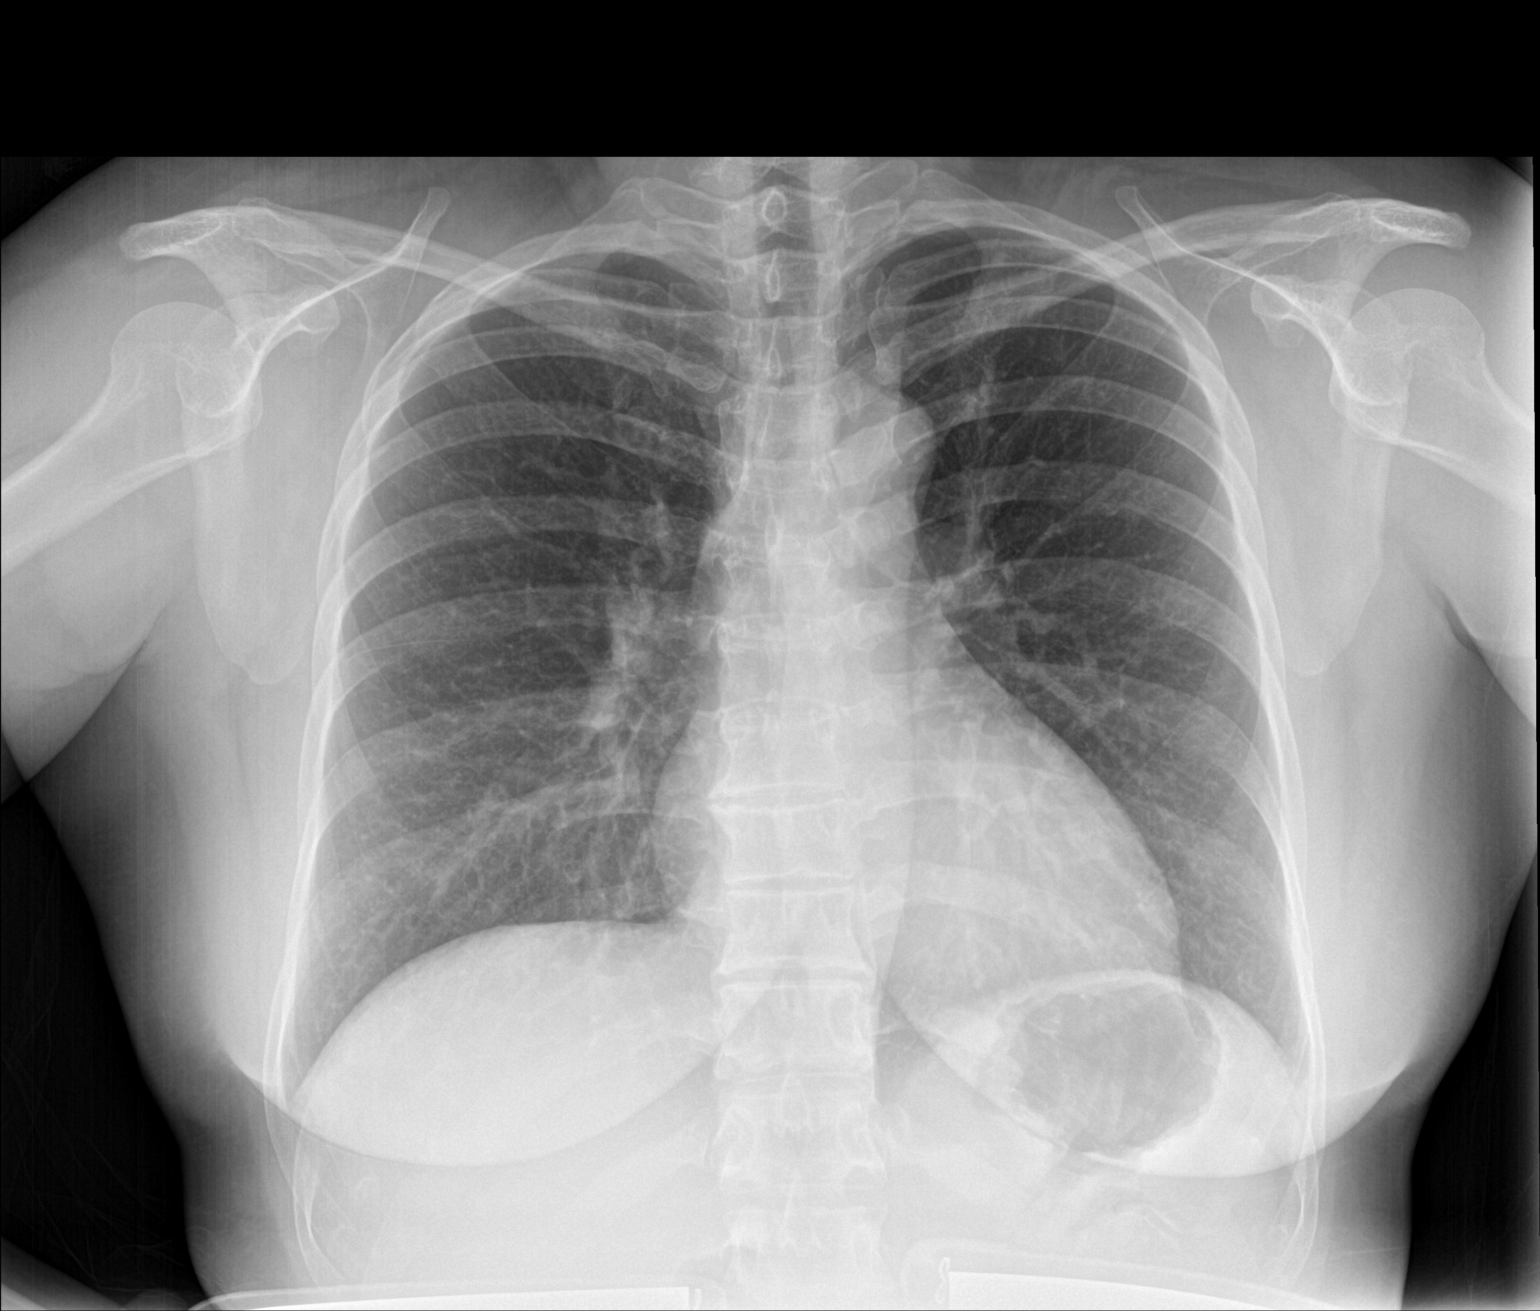

[chest lat]
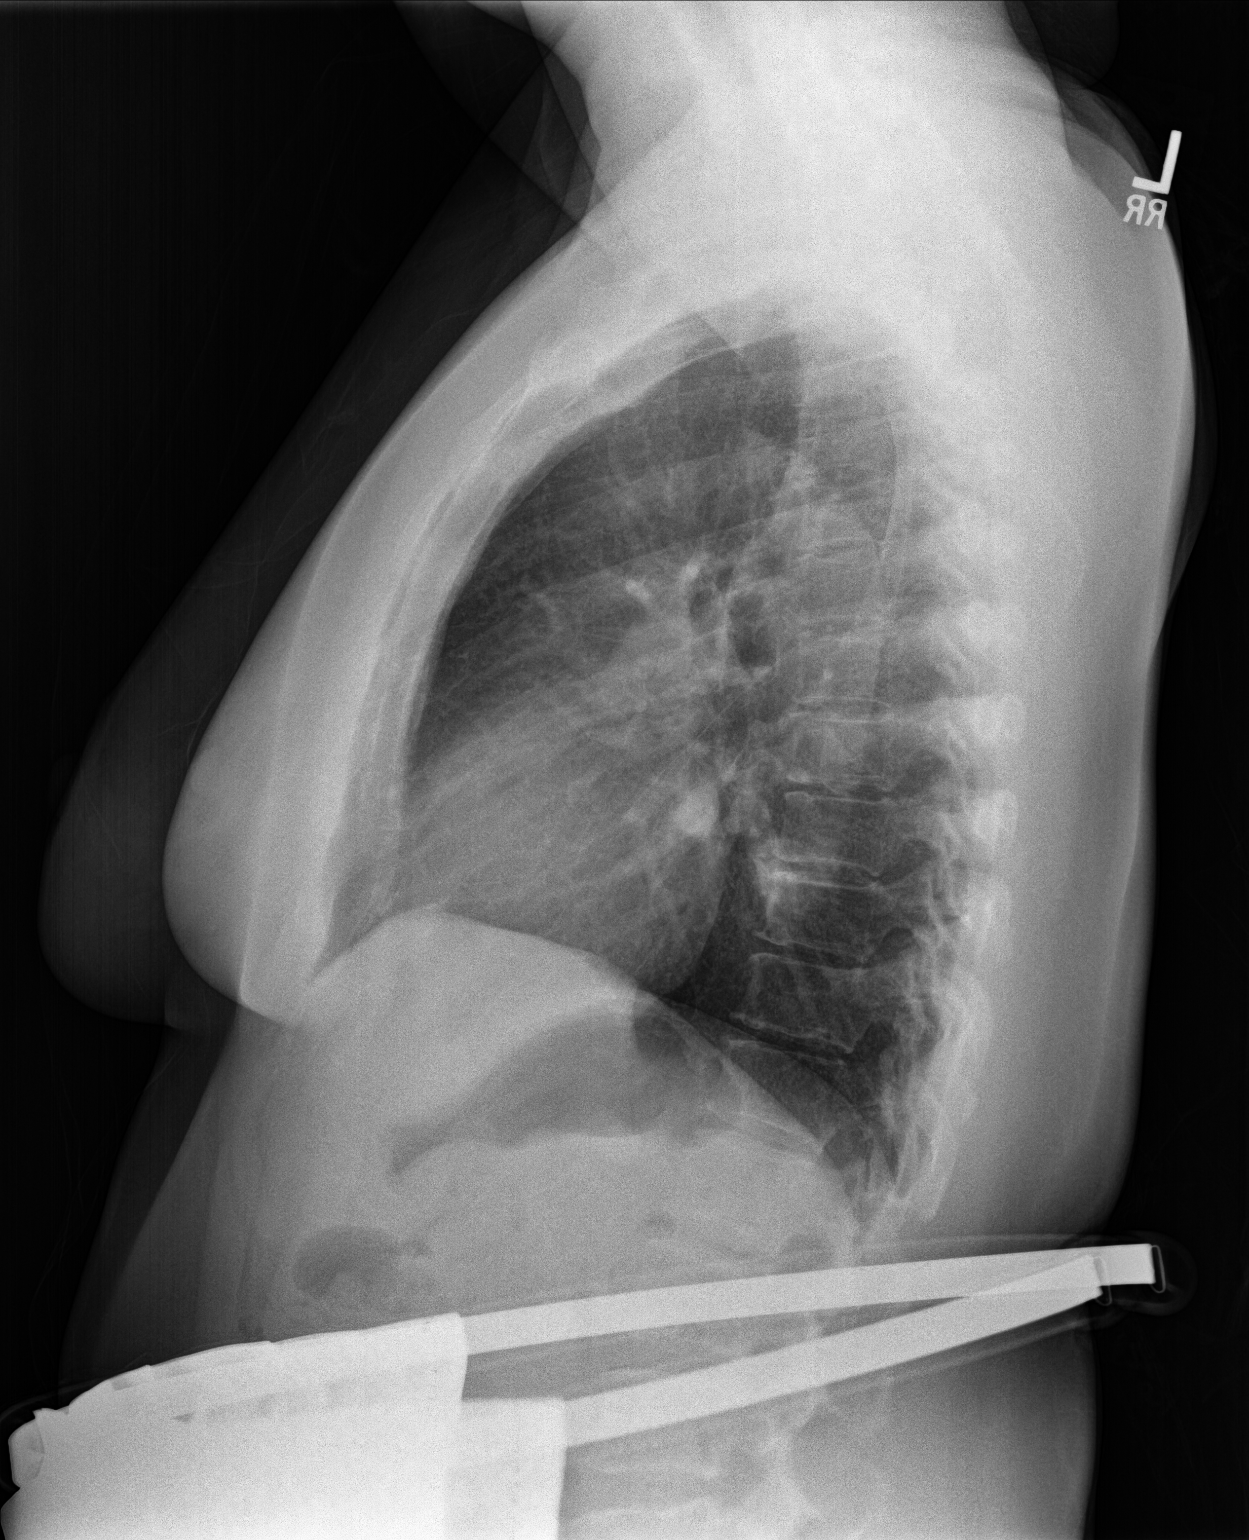

[2 of 2 positions shown; findings below may reference images not displayed]

FINDINGS: The heart size and mediastinal contours are within normal limits.
Both lungs are clear. The visualized skeletal structures are
unremarkable.
IMPRESSION: No active cardiopulmonary disease.

## 2018-12-11 DIAGNOSIS — L720 Epidermal cyst: Secondary | ICD-10-CM | POA: Diagnosis not present

## 2018-12-11 DIAGNOSIS — L700 Acne vulgaris: Secondary | ICD-10-CM | POA: Diagnosis not present

## 2018-12-11 MED FILL — MINOCYCLINE 100 MG CAPSULE: 100 | 30 days supply | Qty: 30 | Fill #0

## 2019-02-12 DIAGNOSIS — Z6828 Body mass index (BMI) 28.0-28.9, adult: Secondary | ICD-10-CM | POA: Diagnosis not present

## 2019-02-12 DIAGNOSIS — Z3200 Encounter for pregnancy test, result unknown: Secondary | ICD-10-CM | POA: Diagnosis not present

## 2019-02-12 DIAGNOSIS — Z01419 Encounter for gynecological examination (general) (routine) without abnormal findings: Secondary | ICD-10-CM | POA: Diagnosis not present

## 2019-02-12 DIAGNOSIS — Z1231 Encounter for screening mammogram for malignant neoplasm of breast: Secondary | ICD-10-CM | POA: Diagnosis not present

## 2019-02-12 DIAGNOSIS — Z1151 Encounter for screening for human papillomavirus (HPV): Secondary | ICD-10-CM | POA: Diagnosis not present

## 2019-02-12 LAB — HM MAMMOGRAPHY: HM Mammogram: ABNORMAL — AB (ref 0–4)

## 2019-02-19 ENCOUNTER — Other Ambulatory Visit: Payer: Self-pay

## 2019-02-19 ENCOUNTER — Encounter: Payer: Self-pay | Admitting: Family Medicine

## 2019-02-24 DIAGNOSIS — N6011 Diffuse cystic mastopathy of right breast: Secondary | ICD-10-CM | POA: Diagnosis not present

## 2019-03-03 DIAGNOSIS — R140 Abdominal distension (gaseous): Secondary | ICD-10-CM | POA: Diagnosis not present

## 2019-03-03 DIAGNOSIS — N920 Excessive and frequent menstruation with regular cycle: Secondary | ICD-10-CM | POA: Diagnosis not present

## 2019-03-12 ENCOUNTER — Other Ambulatory Visit: Payer: Self-pay

## 2019-03-12 ENCOUNTER — Ambulatory Visit: Payer: 59 | Attending: Obstetrics | Admitting: Physical Therapy

## 2019-03-12 DIAGNOSIS — M6281 Muscle weakness (generalized): Secondary | ICD-10-CM | POA: Diagnosis not present

## 2019-03-12 DIAGNOSIS — M545 Low back pain, unspecified: Secondary | ICD-10-CM

## 2019-03-12 DIAGNOSIS — G8929 Other chronic pain: Secondary | ICD-10-CM | POA: Diagnosis not present

## 2019-03-12 DIAGNOSIS — M5412 Radiculopathy, cervical region: Secondary | ICD-10-CM | POA: Diagnosis not present

## 2019-03-12 DIAGNOSIS — R293 Abnormal posture: Secondary | ICD-10-CM | POA: Diagnosis not present

## 2019-03-12 DIAGNOSIS — M62830 Muscle spasm of back: Secondary | ICD-10-CM | POA: Diagnosis not present

## 2019-03-12 DIAGNOSIS — M542 Cervicalgia: Secondary | ICD-10-CM | POA: Diagnosis not present

## 2019-03-12 NOTE — Patient Instructions (Signed)
    Home exercise program created by Chelsei Mcchesney, PT.  For questions, please contact Fabrizio Filip via phone at [phone] or email at [email or web address]  [facility] [place] [address]  [address] [place], [place], [address] Phone: [phone]   Fax:  [phone]    

## 2019-03-12 NOTE — Therapy (Signed)
[facility] [address]  [address] [place], [place], [address] Phone: [phone]   Fax:  [phone]  Physical Therapy Evaluation  Patient Details  Name: Kaitlyn Bennett MRN: [id number] Date of Birth: Dec 02, 1970 Referring Provider (PT): Tresa Endo A. Ernestina Penna, MD   Encounter Date: 03/12/2019  PT End of Session - 03/12/19 0958    Visit Number  1    Number of Visits  12    Date for PT Re-Evaluation  04/23/19    Authorization Type  [facility] - VL: [place]    PT Start Time  0958    PT Stop Time  1108    PT Time Calculation (min)  70 min    Activity Tolerance  Patient tolerated treatment well    Behavior During Therapy  [facility] for tasks assessed/performed       No past medical history on file.  Past Surgical History:  Procedure Laterality Date  . CESAREAN SECTION     x2  . OOPHORECTOMY     2000    There were no vitals filed for this visit.   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 Had muscle spasms and was prescribed gabapentin and PT but admits to noncompliance with both. Now having neck and low back pain (originating ~July 2020) with intermittent tingling in L>R fingertips (R only after exercise). 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 Also reports h/o gastrocnemius tear in 2020.    Limitations  Sitting    How long can you sit comfortably?  <30 minutes    Diagnostic tests  n/a    Patient Stated Goals  "to do things right (with exercise) this time"    Currently in Pain?  No/denies    Pain Score  0-No pain   7/10 on average   Pain Location  Back    Pain Orientation  Left;Lower    Pain Descriptors / Indicators  --   "pinching"   Pain Type  Chronic pain    Pain Radiating Towards  n/a    Pain Onset  --   July 2020   Pain Frequency  Intermittent    Aggravating Factors   prolonged sitting or standing    Pain  Relieving Factors  bending forward    Effect of Pain on Daily Activities  avoids exercising due to pain    Multiple Pain Sites  Yes    Pain Score  0   5/10 on average   Pain Location  Neck    Pain Orientation  Left;Upper    Pain Descriptors / Indicators  Pressure;Spasm    Pain Type  Chronic pain    Pain Radiating Towards  numbness and tingling in fingertips (L>R - R typically onyl after trying to exercise such as on elliptical)    Pain Onset  Other (comment)   2018   Pain Frequency  Intermittent    Aggravating Factors   exercising, pusing her IV cart, sleeping position    Pain Relieving Factors  hot packs    Effect of Pain on Daily Activities  avoids turning head (turns whole body), intermittent difficulty pushing IV cart, avoids exercising         [facility] PT Assessment - 03/12/19 0958      Assessment   Medical Diagnosis  Back & neck pain    Referring  Provider (PT)  Floyce Stakes. Pamala Hurry, MD    Onset Date/Surgical Date  --   back - July 2020; neck - 2018   Hand Dominance  Right    Next MD Visit  none scheduled    Prior Therapy  none - previously referred but did not follow through      Precautions   Precautions  None      Balance Screen   Has the patient fallen in the past 6 months  No    Has the patient had a decrease in activity level because of a fear of falling?   No    Is the patient reluctant to leave their home because of a fear of falling?   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 [employer]  walking 30 min - 2x/wk      Cognition   Overall Cognitive Status  Within Functional Limits for tasks assessed       Observation/Other Assessments   Focus on Therapeutic Outcomes (FOTO)   Neck - 52% (48% limitation); Predicted 65% (35% limitation)      Sensation   Light Touch  Appears Intact    Additional Comments  intermittent numbness and tingling in B fingertips (L>R)      Posture/Postural Control   Posture/Postural Control  Postural limitations    Postural Limitations  Rounded Shoulders;Decreased lumbar lordosis      ROM / Strength   AROM / PROM / Strength  AROM;Strength      AROM   Overall AROM Comments  B shoulder & hip ROM WNL - numbness & tingling in L hand with L UE overhead elevation    AROM Assessment Site  Cervical;Lumbar    Cervical Flexion  47    Cervical Extension  46    Cervical - Right Side Bend  30    Cervical - Left Side Bend  25    Cervical - Right Rotation  60    Cervical - Left Rotation  65    Lumbar Flexion  hands to 2" above toes, pain only with overpressure    Lumbar Extension  WNL    Lumbar - Right Side Bend  hand to lateral joint line of knee    Lumbar - Left Side Bend  hand to lateral joint line of knee    Lumbar - Right Rotation  WFL    Lumbar - Left Rotation  [facility]      Strength   Strength Assessment Site  Shoulder;Hip;Knee    Right/Left Shoulder  Right;Left    Right Shoulder Flexion  4+/5    Right Shoulder ABduction  4+/5    Right Shoulder Internal Rotation  4+/5    Right Shoulder External Rotation  4+/5    Left Shoulder Flexion  4/5    Left Shoulder ABduction  4/5    Left Shoulder Internal Rotation  4+/5    Left Shoulder External Rotation  4/5    Right/Left Hip  Right;Left    Right  Hip Flexion  4/5    Right Hip Extension  4/5    Right Hip External Rotation   4/5    Right Hip Internal Rotation  4/5    Right Hip ABduction  4-/5    Right Hip ADduction  4-/5    Left Hip Flexion  4/5    Left Hip Extension  4/5    Left Hip External Rotation  4-/5    Left Hip Internal Rotation  4-/5    Left Hip ABduction  4-/5    Left Hip ADduction  3+/5     Right/Left Knee  Right;Left    Right Knee Flexion  4-/5    Right Knee Extension  4+/5    Left Knee Flexion  4-/5    Left Knee Extension  4+/5      Flexibility   Soft Tissue Assessment /Muscle Length  yes    Hamstrings  mild tight B    Quadriceps  WNL    ITB  mild tight L>R    Piriformis  mild/mod tight L>R      Palpation   Palpation comment  increased muscle tension in B UT, LS, pecs, cervical paraspinals and suboccipitals - denies pain; increased muscle tension in lumbar parapsinals, glutes and piriformis (L>R) - denies pain                Objective measurements completed on examination: See above findings.      [facility] Adult PT Treatment/Exercise - 03/12/19 0958      Neck Exercises: Seated   Neck Retraction  10 reps;5 secs    Shoulder Rolls  Backwards;10 reps      Neck Exercises: Stretches   Upper Trapezius Stretch  Right;Left;30 seconds;2 reps    Upper Trapezius Stretch Limitations  hand under hip to avoid shoulder hike    Levator Stretch  Right;Left;30 seconds;2 reps    Levator Stretch Limitations  hand behind hip             PT Education - 03/12/19 1109    Education Details  PT eval findings, anticipated POC and initial HEP    Person(s) Educated  Patient    Methods  Explanation;Demonstration;Verbal cues;Handout    Comprehension  Verbalized understanding;Returned demonstration;Verbal cues required;Need further instruction       PT Short Term Goals - 03/12/19 1108      PT SHORT TERM GOAL #1   Title  Patient will be independent with initial HEP    Status  New    Target Date  03/26/19      PT SHORT TERM GOAL #2   Title  Patient will verbalize/demonstrate good awareness of neutral spine posture and proper body mechanics for daily tasks    Status  New    Target Date  04/02/19        PT Long Term Goals - 03/12/19 1108      PT LONG TERM GOAL #1   Title  Patient will be independent with ongoing/advanced HEP    Status  New    Target Date   04/23/19      PT LONG TERM GOAL #2   Title  Patient to report pain reduction in frequency and intensity by >/= 50%    Status  New    Target Date  04/23/19      PT LONG TERM GOAL #3   Title  Patient to improve cervical and lumbar AROM to WNL without pain provocation    Status  New  Target Date  04/23/19      PT LONG TERM GOAL #4   Title  Patient will demonstrate improved proximxal UE/LE strength to >/= 4/5 to 4+/5 for improved stabilty and activity tolerance    Status  New    Target Date  04/23/19      PT LONG TERM GOAL #5   Title  Patient to report ability to perform ADLs, household and work-related tasks without increased pain    Status  New    Target Date  04/23/19             Plan - 03/12/19 1108    Clinical Impression Statement  Pearlia is a 49 y/o female who presents to OP PT for chronic neck and low back pain. 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 Intermittent UE radiculopathy associated with certain activities, predominantly numbness and tingling in fingertips after UE exercise or overhead UE elevation but no weakness noted. Onset of LBP in July 2020 again w/o specific MOI, and with no radiculopathy noted. Pain exacerbated by sleeping positions, prolonged sitting or standing, or having to forcefully push her IV cart when rolling over uneven surfaces; and alleviated with hot packs or stretching. She demonstrates a mild forward head and rounded shoulder posture with increased muscle tension noted throughout B cervical and lumbar paraspinals as well as upper shoulder and posterior hip musculature. Mild restrictions noted in cervical and lumbar AROM with B shoulder and hip ROM essentially WFL. Mild proximal UE and LE strength deficits noted, L>R. Pain causes her to guard with movement for fear of "pulling a muscle" and has kept her from exercising as she would like to. Pinky will  benefit from skilled PT to address above deficits and current functional limitations as well as to decrease pain interference with daily activities.    Personal Factors and Comorbidities  Time since onset of injury/illness/exacerbation;Past/Current Experience;Fitness    Examination-Activity Limitations  Sleep;Sit;Reach Overhead;Lift;Carry;Caring for Others    Examination-Participation Restrictions  Other   exercise   Stability/Clinical Decision Making  Evolving/Moderate complexity    Clinical Decision Making  Moderate    Rehab Potential  Good    PT Frequency  2x / week    PT Duration  6 weeks    PT Treatment/Interventions  ADLs/Self Care Home Management;Cryotherapy;Electrical Stimulation;Iontophoresis 4mg /ml Dexamethasone;Moist Heat;Traction;Ultrasound;Functional mobility training;Therapeutic activities;Therapeutic exercise;Neuromuscular re-education;Patient/family education;Manual techniques;Passive range of motion;Dry needling;Taping;Spinal Manipulations;Joint Manipulations    PT Next Visit Plan  Review and progress cervical HEP & establish lumbar HEP; posture and body mechanics education; manual therapy and modalities PRN    Consulted and Agree with Plan of Care  Patient       Patient will benefit from skilled therapeutic intervention in order to improve the following deficits and impairments:  Decreased activity tolerance, Decreased knowledge of precautions, Decreased mobility, Decreased range of motion, Decreased strength, Increased fascial restricitons, Increased muscle spasms, Impaired perceived functional ability, Impaired flexibility, Impaired sensation, Impaired UE functional use, Improper body mechanics, Postural dysfunction, Pain  Visit Diagnosis: Cervicalgia  Radiculopathy, cervical region  Chronic left-sided low back pain without sciatica  Abnormal posture  Muscle weakness (generalized)  Muscle spasm of back     Problem List Patient Active Problem List   Diagnosis  Date Noted  . Gastrocnemius tear, right, initial encounter 08/22/2018  . 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 Nonallopathic lesion of cervical region 02/13/2016  . Nonallopathic lesion of thoracic  region 02/13/2016  . Nonallopathic lesion of rib cage 02/13/2016    Marry Guan, PT, MPT 03/12/2019, 3:56 PM  [facility] [address]  [address] [place], [place], [address] Phone: [phone]   Fax:  [phone]  Name: Kaitlyn Bennett MRN: [id number] Date of Birth: 03/16/1970

## 2019-03-16 ENCOUNTER — Ambulatory Visit: Payer: 59 | Admitting: Physical Therapy

## 2019-03-16 ENCOUNTER — Encounter: Payer: Self-pay | Admitting: Physical Therapy

## 2019-03-16 ENCOUNTER — Other Ambulatory Visit: Payer: Self-pay

## 2019-03-16 DIAGNOSIS — R293 Abnormal posture: Secondary | ICD-10-CM | POA: Diagnosis not present

## 2019-03-16 DIAGNOSIS — G8929 Other chronic pain: Secondary | ICD-10-CM

## 2019-03-16 DIAGNOSIS — M5412 Radiculopathy, cervical region: Secondary | ICD-10-CM

## 2019-03-16 DIAGNOSIS — M62830 Muscle spasm of back: Secondary | ICD-10-CM | POA: Diagnosis not present

## 2019-03-16 DIAGNOSIS — M542 Cervicalgia: Secondary | ICD-10-CM | POA: Diagnosis not present

## 2019-03-16 DIAGNOSIS — M6281 Muscle weakness (generalized): Secondary | ICD-10-CM | POA: Diagnosis not present

## 2019-03-16 DIAGNOSIS — M545 Low back pain: Secondary | ICD-10-CM | POA: Diagnosis not present

## 2019-03-16 NOTE — Therapy (Signed)
[facility] [address]  [address] [place], [place], [address] Phone: [phone]   Fax:  [phone]  Physical Therapy Treatment  Patient Details  Name: Kaitlyn Bennett MRN: [id number] Date of Birth: 09-30-1970 Referring Provider (PT): Tresa Endo A. Ernestina Penna, MD   Encounter Date: 03/16/2019  PT End of Session - 03/16/19 3419    Visit Number  2    Number of Visits  12    Date for PT Re-Evaluation  04/23/19    Authorization Type  [facility] - VL: [place]    PT Start Time  0808    PT Stop Time  0850    PT Time Calculation (min)  42 min    Activity Tolerance  Patient tolerated treatment well    Behavior During Therapy  [facility] for tasks assessed/performed       History reviewed. No pertinent past medical history.  Past Surgical History:  Procedure Laterality Date  . CESAREAN SECTION     x2  . OOPHORECTOMY     2000    There were no vitals filed for this visit.  Subjective Assessment - 03/16/19 0813    Subjective  PT reporting completing HEP 2x/day - stretches feel good but not noting a significant difference thus far. Reporting L fingertip numbness during warm-up.    Diagnostic tests  n/a    Patient Stated Goals  "to do things right (with exercise) this time"    Currently in Pain?  No/denies                       [facility] Adult PT Treatment/Exercise - 03/16/19 0808      Neck Exercises: Machines for Strengthening   Nustep  L3 x 6 min - 2 min with UE/LE, last 4 min LE only d/t numbness in L fingertips   seat #4     Neck Exercises: Seated   Neck Retraction  10 reps;5 secs    Neck Retraction Limitations  cues to avoid excessive protraction    Shoulder Rolls  Backwards;10 reps      Lumbar Exercises: Stretches   Passive Hamstring Stretch  Right;Left;30 seconds;2 reps    Passive Hamstring Stretch Limitations  supine with strap, cues to avoid posterior pelvic tilt    Single Knee to Chest Stretch  Right;Left;30 seconds;2  reps    Single Knee to Chest Stretch Limitations  opp LE straight    Double Knee to Chest Stretch  30 seconds;1 rep    Double Knee to Chest Stretch Limitations  too strenous    Lower Trunk Rotation  30 seconds;2 reps    Piriformis Stretch  Right;Left;30 seconds;2 reps    Piriformis Stretch Limitations  KTOS    Figure 4 Stretch  30 seconds;2 reps;Supine;With overpressure    Figure 4 Stretch Limitations  figure 4 to chest      Lumbar Exercises: Supine   Pelvic Tilt  10 reps;5 seconds      Hand Exercises for Cervical Radiculopathy   Other Hand Exercise for Cervical Radiculopathy  L brachial plexus nerve glide x 10      Neck Exercises: Stretches   Upper Trapezius Stretch  Right;Left;30 seconds;2 reps    Upper Trapezius Stretch Limitations  hand under hip to avoid shoulder hike    Levator Stretch  Right;Left;30 seconds;2 reps    Levator Stretch Limitations  hand behind hip with cues to keep shoulder down & back  PT Education - 03/16/19 0850    Education Details  Low back initial HEP    Person(s) Educated  Patient    Methods  Explanation;Demonstration    Comprehension  Verbalized understanding;Returned demonstration;Need further instruction       PT Short Term Goals - 03/16/19 0817      PT SHORT TERM GOAL #1   Title  Patient will be independent with initial HEP    Status  On-going    Target Date  03/26/19      PT SHORT TERM GOAL #2   Title  Patient will verbalize/demonstrate good awareness of neutral spine posture and proper body mechanics for daily tasks    Status  On-going    Target Date  04/02/19        PT Long Term Goals - 03/16/19 0818      PT LONG TERM GOAL #1   Title  Patient will be independent with ongoing/advanced HEP    Status  On-going    Target Date  04/23/19      PT LONG TERM GOAL #2   Title  Patient to report pain reduction in frequency and intensity by >/= 50%    Status  On-going    Target Date  04/23/19      PT LONG TERM GOAL #3    Title  Patient to improve cervical and lumbar AROM to WNL without pain provocation    Status  On-going    Target Date  04/23/19      PT LONG TERM GOAL #4   Title  Patient will demonstrate improved proximxal UE/LE strength to >/= 4/5 to 4+/5 for improved stabilty and activity tolerance    Status  On-going    Target Date  04/23/19      PT LONG TERM GOAL #5   Title  Patient to report ability to perform ADLs, household and work-related tasks without increased pain    Status  On-going    Target Date  04/23/19            Plan - 03/16/19 0820    Clinical Impression Statement  Jhane reporting no issues with initial cervical HEP but upon review, she reported that reverse shoulder rolls triggered onset of numbness and tingling in L fingertips. This was also noted during warm-up on NuStep with using UE handles (UE discontinued during warm-up), therefore instructed patient in UE/brachial plexus nerve glides to be added to cervical HEP and used when numbness and tingling occur. Remainder of session focusing on introduction of initial HEP for low back pain with patient reporting good tolerance for stretches and noting especially the piriformis stretches seem to target the area where her pain is localized. Will plan to cover training in posture and body mechanics next session.    Personal Factors and Comorbidities  Time since onset of injury/illness/exacerbation;Past/Current Experience;Fitness    Examination-Activity Limitations  Sleep;Sit;Reach Overhead;Lift;Carry;Caring for Others    Examination-Participation Restrictions  Other   exercise   Stability/Clinical Decision Making  Evolving/Moderate complexity    Rehab Potential  Good    PT Frequency  2x / week    PT Duration  6 weeks    PT Treatment/Interventions  ADLs/Self Care Home Management;Cryotherapy;Electrical Stimulation;Iontophoresis 4mg /ml Dexamethasone;Moist Heat;Traction;Ultrasound;Functional mobility training;Therapeutic  activities;Therapeutic exercise;Neuromuscular re-education;Patient/family education;Manual techniques;Passive range of motion;Dry needling;Taping;Spinal Manipulations;Joint Manipulations    PT Next Visit Plan  Review and progress cervical & lumbar HEPs; posture and body mechanics education; manual therapy and modalities PRN    PT Home Exercise Plan  03/12/19 - cervical retraction, UT & LS stretches, backward shoulder rolls; 03/16/19 - HS, SKTC, piriformis (KTOS & figure 4 to chest) stretches, LTR, pelvic tolt    Consulted and Agree with Plan of Care  Patient       Patient will benefit from skilled therapeutic intervention in order to improve the following deficits and impairments:  Decreased activity tolerance, Decreased knowledge of precautions, Decreased mobility, Decreased range of motion, Decreased strength, Increased fascial restricitons, Increased muscle spasms, Impaired perceived functional ability, Impaired flexibility, Impaired sensation, Impaired UE functional use, Improper body mechanics, Postural dysfunction, Pain  Visit Diagnosis: Cervicalgia  Radiculopathy, cervical region  Chronic left-sided low back pain without sciatica  Abnormal posture  Muscle weakness (generalized)  Muscle spasm of back     Problem List Patient Active Problem List   Diagnosis Date Noted  . Gastrocnemius tear, right, initial encounter 08/22/2018  . 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 Nonallopathic lesion of cervical region 02/13/2016  . Nonallopathic lesion of thoracic region 02/13/2016  . Nonallopathic lesion of rib cage 02/13/2016    Percival Spanish, PT, MPT 03/16/2019, 1:14 PM  [facility] [address]  [place], [place], [address] Phone: [phone]   Fax:  [phone]  Name: KARLYNN FURROW MRN: [id number] Date of Birth: 19-Aug-1970

## 2019-03-16 NOTE — Patient Instructions (Signed)
    Home exercise program created by JoAnne Kreis, PT.  For questions, please contact JoAnne via phone at [phone] or email at [email or web address]  [facility] [place] [address]  [address] [place], [place], [address] Phone: [phone]   Fax:  [phone]    

## 2019-03-19 ENCOUNTER — Encounter: Payer: Self-pay | Admitting: Physical Therapy

## 2019-03-19 ENCOUNTER — Ambulatory Visit: Payer: 59 | Admitting: Physical Therapy

## 2019-03-19 ENCOUNTER — Other Ambulatory Visit: Payer: Self-pay

## 2019-03-19 DIAGNOSIS — M542 Cervicalgia: Secondary | ICD-10-CM | POA: Diagnosis not present

## 2019-03-19 DIAGNOSIS — R293 Abnormal posture: Secondary | ICD-10-CM

## 2019-03-19 DIAGNOSIS — M6281 Muscle weakness (generalized): Secondary | ICD-10-CM | POA: Diagnosis not present

## 2019-03-19 DIAGNOSIS — M5412 Radiculopathy, cervical region: Secondary | ICD-10-CM

## 2019-03-19 DIAGNOSIS — M545 Low back pain, unspecified: Secondary | ICD-10-CM

## 2019-03-19 DIAGNOSIS — M62830 Muscle spasm of back: Secondary | ICD-10-CM

## 2019-03-19 DIAGNOSIS — G8929 Other chronic pain: Secondary | ICD-10-CM

## 2019-03-19 NOTE — Therapy (Signed)
[facility] [address]  [address] [place], [place], [address] Phone: [phone]   Fax:  [phone]  Physical Therapy Treatment  Patient Details  Name: Kaitlyn Bennett MRN: [id number] Date of Birth: 1970-12-05 Referring Provider (PT): Tresa Endo A. Ernestina Penna, MD   Encounter Date: 03/19/2019  PT End of Session - 03/19/19 0850    Visit Number  3    Number of Visits  12    Date for PT Re-Evaluation  04/23/19    Authorization Type  [facility] - VL: [place]    PT Start Time  0850    PT Stop Time  0945    PT Time Calculation (min)  55 min    Activity Tolerance  Patient tolerated treatment well    Behavior During Therapy  [facility] for tasks assessed/performed       History reviewed. No pertinent past medical history.  Past Surgical History:  Procedure Laterality Date  . CESAREAN SECTION     x2  . OOPHORECTOMY     2000    There were no vitals filed for this visit.  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    Diagnostic tests  n/a    Patient Stated Goals  "to do things right (with exercise) this time"    Currently in Pain?  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      Neck Exercises: Machines for Strengthening   UBE (Upper Arm Bike)  L1.5 x 1 min forward - stopped d/t onset of numbness in L fingertips    Nustep  L4 x 6 min (LE only)      Manual Therapy   Manual Therapy  Soft tissue mobilization;Myofascial release;Other (comment)    Manual therapy comments  supine    Soft tissue mobilization  STM/DTM to B UT/LS - increased tension/taut bands greater on L than R    Myofascial Release  manual TPR to L UT/LS    Other Manual Therapy  Instruction in use of Theracane for self-STM/TPR  PT Education - 03/19/19 0930    Education Details  Posture and body mechanics with typical daily tasks, 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; Use of Theracane for self-STM/TPR    [employer]) Educated  Patient    Methods  Explanation;Demonstration;Handout    Comprehension  Verbalized understanding;Returned demonstration;Need further instruction       PT Short Term Goals - 03/16/19 0817      PT SHORT TERM GOAL #1   Title  Patient will be independent with initial HEP    Status  On-going    Target Date  03/26/19      PT SHORT TERM GOAL #2   Title  Patient will verbalize/demonstrate good awareness of neutral spine posture and proper body mechanics for daily tasks    Status  On-going    Target Date  04/02/19        PT Long Term Goals - 03/16/19 0818      PT LONG TERM GOAL #1   Title  Patient will be independent with ongoing/advanced HEP    Status  On-going    Target Date  04/23/19      PT LONG TERM GOAL #2   Title  Patient to report  pain reduction in frequency and intensity by >/= 50%    Status  On-going    Target Date  04/23/19      PT LONG TERM GOAL #3   Title  Patient to improve cervical and lumbar AROM to WNL without pain provocation    Status  On-going    Target Date  04/23/19      PT LONG TERM GOAL #4   Title  Patient will demonstrate improved proximxal UE/LE strength to >/= 4/5 to 4+/5 for improved stabilty and activity tolerance    Status  On-going    Target Date  04/23/19      PT LONG TERM GOAL #5   Title  Patient to report ability to perform ADLs, household and work-related tasks without increased pain    Status  On-going    Target Date  04/23/19            Plan - 03/19/19 0945    Clinical Impression Statement  Etta reporting no questions or concerns with LBP HEP but reports she has not had a chance to find a strap yet to use for HS stretch. She has tried utilizing UE nerve glide when L fingertip numbness occurs at work with some initial relief, but numbness will return the next time she has to push or pull hard on her IV cart. She continues to note L fingertip numbness during warmup attempts both with UBE and NuStep using only LE, with numbness resolving after stopping LE warm-up. 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 Increase muscle tension noted in B UT/LS and scalenes which is likely creating increased pressure on brachial plexus - addressed with manual STM/DTM and TPR with patient reporting that this feels good, therefore provided instruction in use of Theracane for self-STM/TPR. Patient may benefit from trial of DN and/or estim to further address increased muscle tension and pain with patient expressing interest in trying  these on a future visit.    Personal Factors and  Comorbidities  Time since onset of injury/illness/exacerbation;Past/Current Experience;Fitness    Examination-Activity Limitations  Sleep;Sit;Reach Overhead;Lift;Carry;Caring for Others    Examination-Participation Restrictions  Other   exercise   Stability/Clinical Decision Making  Evolving/Moderate complexity    Rehab Potential  Good    PT Frequency  2x / week    PT Duration  6 weeks    PT Treatment/Interventions  ADLs/Self Care Home Management;Cryotherapy;Electrical Stimulation;Iontophoresis 4mg /ml Dexamethasone;Moist Heat;Traction;Ultrasound;Functional mobility training;Therapeutic activities;Therapeutic exercise;Neuromuscular re-education;Patient/family education;Manual techniques;Passive range of motion;Dry needling;Taping;Spinal Manipulations;Joint Manipulations    PT Next Visit Plan  Review and progress cervical & lumbar HEPs; manual therapy including possible trial of DN and modalities PRN; postural strengthening; review of posture and body mechanics education including work simulation PRN    PT Home Exercise Plan  03/12/19 - cervical retraction, UT & LS stretches, backward shoulder rolls; 03/16/19 - HS, SKTC, piriformis (KTOS & figure 4 to chest) stretches, LTR, pelvic tilt    Consulted and Agree with Plan of Care  Patient       Patient will benefit from skilled therapeutic intervention in order to improve the following deficits and impairments:  Decreased activity tolerance, Decreased knowledge of precautions, Decreased mobility, Decreased range of motion, Decreased strength, Increased fascial restricitons, Increased muscle spasms, Impaired perceived functional ability, Impaired flexibility, Impaired sensation, Impaired UE functional use, Improper body mechanics, Postural dysfunction, Pain  Visit Diagnosis: Cervicalgia  Radiculopathy, cervical region  Chronic left-sided low back pain without sciatica  Abnormal posture  Muscle weakness  (generalized)  Muscle spasm of back     Problem List Patient Active Problem List   Diagnosis Date Noted  . Gastrocnemius tear, right, initial encounter 08/22/2018  . 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 Nonallopathic lesion of cervical region 02/13/2016  . Nonallopathic lesion of thoracic region 02/13/2016  . Nonallopathic lesion of rib cage 02/13/2016    04/12/2016, PT, MPT 03/19/2019, 10:10 AM  [facility] [address]  [address] [place], [place], [place] Phone: [phone]   Fax:  [phone]  Name: MAKYLAH BOSSARD MRN: [name] Date of Birth: 05/16/1970

## 2019-03-19 NOTE — Patient Instructions (Signed)

## 2019-03-25 ENCOUNTER — Ambulatory Visit: Payer: 59 | Admitting: Physical Therapy

## 2019-03-25 ENCOUNTER — Other Ambulatory Visit: Payer: Self-pay

## 2019-03-25 ENCOUNTER — Encounter: Payer: Self-pay | Admitting: Physical Therapy

## 2019-03-25 DIAGNOSIS — M62830 Muscle spasm of back: Secondary | ICD-10-CM

## 2019-03-25 DIAGNOSIS — R293 Abnormal posture: Secondary | ICD-10-CM

## 2019-03-25 DIAGNOSIS — G8929 Other chronic pain: Secondary | ICD-10-CM

## 2019-03-25 DIAGNOSIS — M6281 Muscle weakness (generalized): Secondary | ICD-10-CM

## 2019-03-25 DIAGNOSIS — M5412 Radiculopathy, cervical region: Secondary | ICD-10-CM

## 2019-03-25 DIAGNOSIS — M545 Low back pain, unspecified: Secondary | ICD-10-CM

## 2019-03-25 DIAGNOSIS — M542 Cervicalgia: Secondary | ICD-10-CM | POA: Diagnosis not present

## 2019-03-25 NOTE — Therapy (Signed)
[facility] [address]  [address] [place], [place], [address] Phone: [phone]   Fax:  [phone]  Physical Therapy Treatment  Patient Details  Name: Kaitlyn Bennett MRN: [id number] Date of Birth: 23-Aug-1970 Referring Provider (PT): Tresa Endo A. Ernestina Penna, MD   Encounter Date: 03/25/2019  PT End of Session - 03/25/19 0933    Visit Number  4    Number of Visits  12    Date for PT Re-Evaluation  04/23/19    Authorization Type  [facility] - VL: [place]    PT Start Time  0933    PT Stop Time  1020    PT Time Calculation (min)  47 min    Activity Tolerance  Patient tolerated treatment well    Behavior During Therapy  [facility] for tasks assessed/performed       History reviewed. No pertinent past medical history.  Past Surgical History:  Procedure Laterality Date  . CESAREAN SECTION     x2  . OOPHORECTOMY     2000    There were no vitals filed for this visit.  Subjective Assessment - 03/25/19 0936    Subjective  Pt reporting increased discomfort after busy night at work last night. Still having the intermittent L UE numbness at work, but reports she is usually busy enough that she tunes out the low back pain. 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    Diagnostic tests  n/a    Patient Stated Goals  "to do things right (with exercise) this time"    Currently in Pain?  Yes    Pain Score  5     Pain Location  Back    Pain Orientation  Left;Lower    Pain Descriptors / Indicators  Pressure   "pinching"   Pain Type  Chronic pain    Pain Frequency  Intermittent    Pain Score  0    Pain Location  Neck    Pain Radiating Towards  intermittent L>R numbness and tingling in fingertips                       [facility] Adult PT Treatment/Exercise - 03/25/19 0933      Neck Exercises: Machines for Strengthening   Nustep  L4 x 6 min (LE only)      Neck Exercises: Theraband   Scapula Retraction  10 reps   yellow TB   Scapula Retraction Limitations  + alt UE diagonals; hooklying on pool noodle    Shoulder Extension  10 reps   yellow TB   Shoulder Extension Limitations  emphasis on scap retraction with shoulder extension to neutral    Rows  10 reps   yellow TB   Rows Limitations  cues for scap retraction   pt noting onset of L fingertip numbness by end of set   Shoulder External Rotation  10 reps   yellow TB   Shoulder External Rotation Limitations  cues for scap retraction; hooklying on pool noodle    Horizontal ABduction  10 reps   yellow TB   Horizontal ABduction Limitations  cues for scap retraction; hooklying on pool noodle      Lumbar Exercises: Stretches   Quadruped Mid Back Stretch  30 seconds;3 reps    Quadruped Mid Back Stretch Limitations  center child's pose in quadruped (onset of L fingertip numbness) + 3-way seated prayer stretch with  green Pball (no numbess reported)      Neck Exercises: Stretches   Chest Stretch  60 seconds;2 reps    Chest Stretch Limitations  snow angel stretch over pool noodle             PT Education - 03/25/19 1020    Education Details  Information on DN and home TENS unit per pt request; HEP update - yellow TB scap retraction + horiz ABD, ER and UE diagonals in hooklying on pool noodle, standing scap retraction + low row    Person(s) Educated  Patient    Methods  Explanation;Demonstration;Verbal cues;Handout    Comprehension  Verbalized understanding;Returned demonstration;Verbal cues required;Need further instruction       PT Short Term Goals - 03/25/19 0941      PT SHORT TERM GOAL #1   Title  Patient will be independent with initial HEP    Status  Achieved   03/25/19     PT SHORT TERM GOAL #2   Title  Patient will verbalize/demonstrate good awareness of neutral spine posture and proper body mechanics for daily tasks    Status  Achieved   03/25/19       PT Long Term Goals - 03/16/19 0818      PT  LONG TERM GOAL #1   Title  Patient will be independent with ongoing/advanced HEP    Status  On-going    Target Date  04/23/19      PT LONG TERM GOAL #2   Title  Patient to report pain reduction in frequency and intensity by >/= 50%    Status  On-going    Target Date  04/23/19      PT LONG TERM GOAL #3   Title  Patient to improve cervical and lumbar AROM to WNL without pain provocation    Status  On-going    Target Date  04/23/19      PT LONG TERM GOAL #4   Title  Patient will demonstrate improved proximxal UE/LE strength to >/= 4/5 to 4+/5 for improved stabilty and activity tolerance    Status  On-going    Target Date  04/23/19      PT LONG TERM GOAL #5   Title  Patient to report ability to perform ADLs, household and work-related tasks without increased pain    Status  On-going    Target Date  04/23/19            Plan - 03/25/19 0942    Clinical Impression Statement  Glorianne reporting she still experiences increase discomfort after busy shifts at work but does note that the UE/brachial plexus nerve glides seem to help when numbness and tingling presents in her fingertips. She notes good benefit from manual STM/TPR and has purchased a Associate Professor for home use. She has expressed interest in trying DN to further address increased muscle tension but would like to wait until next visit when she will not have to work that night. Treatment session focused on training in scapular stabilization/strengthening to promote improved postural alignment and reduced tension in upper shoulders and neck with HEP updated to include well tolerated exercises (bent arm rows deferred from HEP update due to reproduction of fingertip numbness and tingling).    Personal Factors and Comorbidities  Time since onset of injury/illness/exacerbation;Past/Current Experience;Fitness    Examination-Activity Limitations  Sleep;Sit;Reach Overhead;Lift;Carry;Caring for Others    Examination-Participation Restrictions   Other   exercise   Stability/Clinical Decision Making  Evolving/Moderate complexity  Rehab Potential  Good    PT Frequency  2x / week    PT Duration  6 weeks    PT Treatment/Interventions  ADLs/Self Care Home Management;Cryotherapy;Electrical Stimulation;Iontophoresis 4mg /ml Dexamethasone;Moist Heat;Traction;Ultrasound;Functional mobility training;Therapeutic activities;Therapeutic exercise;Neuromuscular re-education;Patient/family education;Manual techniques;Passive range of motion;Dry needling;Taping;Spinal Manipulations;Joint Manipulations    PT Next Visit Plan  manual therapy including possible trial of DN and modalities PRN; review and progress cervical & lumbar HEPs as indicated; postural strengthening; review of posture and body mechanics education including work simulation PRN    PT Home Exercise Plan  03/12/19 - cervical retraction, UT & LS stretches, backward shoulder rolls; 03/16/19 - HS, SKTC, piriformis (KTOS & figure 4 to chest) stretches, LTR, pelvic tilt; 03/25/19 - yellow TB scap retraction + horiz ABD, ER and UE diagonals in hooklying on pool noodle, standing scap retraction + low row    Consulted and Agree with Plan of Care  Patient       Patient will benefit from skilled therapeutic intervention in order to improve the following deficits and impairments:  Decreased activity tolerance, Decreased knowledge of precautions, Decreased mobility, Decreased range of motion, Decreased strength, Increased fascial restricitons, Increased muscle spasms, Impaired perceived functional ability, Impaired flexibility, Impaired sensation, Impaired UE functional use, Improper body mechanics, Postural dysfunction, Pain  Visit Diagnosis: Cervicalgia  Radiculopathy, cervical region  Chronic left-sided low back pain without sciatica  Abnormal posture  Muscle weakness (generalized)  Muscle spasm of back     Problem List Patient Active Problem List   Diagnosis Date Noted  . Gastrocnemius  tear, right, initial encounter 08/22/2018  . 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 Nonallopathic lesion of cervical region 02/13/2016  . Nonallopathic lesion of thoracic region 02/13/2016  . Nonallopathic lesion of rib cage 02/13/2016    04/12/2016, PT, MPT 03/25/2019, 2:23 PM  [facility] [address]  [address] [place], [place], [place] Phone: [phone]   Fax:  [phone]  Name: HARLEE ECKROTH MRN: [name] Date of Birth: 1970/11/01

## 2019-03-25 NOTE — Patient Instructions (Addendum)
Trigger Point Dry Needling  . What is Trigger Point Dry Needling (DN)? o DN is a physical therapy technique used to treat muscle pain and dysfunction. Specifically, DN helps deactivate muscle trigger points (muscle knots).  o A thin filiform needle is used to penetrate the skin and stimulate the underlying trigger point. The goal is for a local twitch response (LTR) to occur and for the trigger point to relax. No medication of any kind is injected during the procedure.   . What Does Trigger Point Dry Needling Feel Like?  o The procedure feels different for each individual patient. Some patients report that they do not actually feel the needle enter the skin and overall the process is not painful. Very mild bleeding may occur. However, many patients feel a deep cramping in the muscle in which the needle was inserted. This is the local twitch response.   Marland Kitchen How Will I feel after the treatment? o Soreness is normal, and the onset of soreness may not occur for a few hours. Typically this soreness does not last longer than two days.  o Bruising is uncommon, however; ice can be used to decrease any possible bruising.  o In rare cases feeling tired or nauseous after the treatment is normal. In addition, your symptoms may get worse before they get better, this period will typically not last longer than 24 hours.   . What Can I do After My Treatment? o Increase your hydration by drinking more water for the next 24 hours. o You may place ice or heat on the areas treated that have become sore, however, do not use heat on inflamed or bruised areas. Heat often brings more relief post needling. o You can continue your regular activities, but vigorous activity is not recommended initially after the treatment for 24 hours. o DN is best combined with other physical therapy such as strengthening, stretching, and other therapies.    TENS UNIT  This is helpful for muscle pain and spasm.   Search and Purchase a  TENS 7000 2nd edition at www.tenspros.com or www.amazon.com  (It should be less than $30)     TENS unit instructions:   Do not shower or bathe with the unit on  Turn the unit off before removing electrodes or batteries  If the electrodes lose stickiness add a drop of water to the electrodes after they are disconnected from the unit and place on plastic sheet. If you continued to have difficulty, call the TENS unit company to purchase more electrodes.  Do not apply lotion on the skin area prior to use. Make sure the skin is clean and dry as this will help prolong the life of the electrodes.  After use, always check skin for unusual red areas, rash or other skin difficulties. If there are any skin problems, does not apply electrodes to the same area.  Never remove the electrodes from the unit by pulling the wires.  Do not use the TENS unit or electrodes other than as directed.  Do not change electrode placement without consulting your therapist or physician.  Keep 2 fingers with between each electrode.  TENS stands for Transcutaneous Electrical Nerve Stimulation. In other words, electrical impulses are allowed to pass through the skin in order to excite a nerve.   Purpose and Use of TENS:  TENS is a method used to manage acute and chronic pain without the use of drugs. It has been effective in managing pain associated with surgery, sprains, strains, trauma,  rheumatoid arthritis, and neuralgias. It is a non-addictive, low risk, and non-invasive technique used to control pain. It is not, by any means, a curative form of treatment.   How TENS Works:  Most TENS units are a Paramedic unit powered by one 9 volt battery. Attached to the outside of the unit are two lead wires where two pins and/or snaps connect on each wire. All units come with a set of four reusable pads or electrodes. These are placed on the skin surrounding the area involved. By inserting the leads into  the pads, the  electricity can pass from the unit making the circuit complete.  As the intensity is turned up slowly, the electrical current enters the body from the electrodes through the skin to the surrounding nerve fibers. This triggers the release of hormones from within the body. These hormones contain pain relievers. By increasing the circulation of these hormones, the person's pain may be lessened. It is also believed that the electrical stimulation itself helps to block the pain messages being sent to the brain, thus also decreasing the body's perception of pain.   Hazards:  TENS units are NOT to be used by patients with PACEMAKERS, DEFIBRILLATORS, DIABETIC PUMPS, PREGNANT WOMEN, and patients with SEIZURE DISORDERS.  TENS units are NOT to be used over the heart, throat, brain, or spinal cord.  One of the major side effects from the TENS unit may be skin irritation. Some people may develop a rash if they are sensitive to the materials used in the electrodes or the connecting wires.   Wear the unit for up to 30-45 minutes at a time, 3-4x/day as needed.   Avoid overuse due the body getting used to the stem making it not as effective over time.         Home exercise program created by Annie Paras, PT.  For questions, please contact Srijan Givan via phone at [phone] or email at [email or web address]  [facility] [address]  [place], [place], [place] Phone: [phone]   Fax:  [phone]

## 2019-03-27 ENCOUNTER — Encounter: Payer: Self-pay | Admitting: Physical Therapy

## 2019-03-27 ENCOUNTER — Ambulatory Visit: Payer: 59 | Admitting: Physical Therapy

## 2019-03-27 ENCOUNTER — Other Ambulatory Visit: Payer: Self-pay

## 2019-03-27 DIAGNOSIS — M5412 Radiculopathy, cervical region: Secondary | ICD-10-CM | POA: Diagnosis not present

## 2019-03-27 DIAGNOSIS — G8929 Other chronic pain: Secondary | ICD-10-CM | POA: Diagnosis not present

## 2019-03-27 DIAGNOSIS — M545 Low back pain: Secondary | ICD-10-CM | POA: Diagnosis not present

## 2019-03-27 DIAGNOSIS — M6281 Muscle weakness (generalized): Secondary | ICD-10-CM

## 2019-03-27 DIAGNOSIS — M62830 Muscle spasm of back: Secondary | ICD-10-CM

## 2019-03-27 DIAGNOSIS — R293 Abnormal posture: Secondary | ICD-10-CM

## 2019-03-27 DIAGNOSIS — M542 Cervicalgia: Secondary | ICD-10-CM

## 2019-03-27 NOTE — Therapy (Signed)
[facility] [address]  [place], [place], [address] Phone: [phone]   Fax:  [phone]  Physical Therapy Treatment  Patient Details  Name: Kaitlyn Bennett MRN: [id number] Date of Birth: 11/25/1970 Referring Provider (PT): Claiborne Billings A. Pamala Hurry, MD   Encounter Date: 03/27/2019  PT End of Session - 03/27/19 0849    Visit Number  5    Number of Visits  12    Date for PT Re-Evaluation  04/23/19    Authorization Type  [facility] - VL: [place]    PT Start Time  0849    PT Stop Time  1000    PT Time Calculation (min)  71 min    Activity Tolerance  Patient tolerated treatment well    Behavior During Therapy  [facility] for tasks assessed/performed       History reviewed. No pertinent past medical history.  Past Surgical History:  Procedure Laterality Date  . CESAREAN SECTION     x2  . OOPHORECTOMY     2000    There were no vitals filed for this visit.  Subjective Assessment - 03/27/19 0853    Subjective  Pt reporting symptoms unchanged and interested in trying DN today as she will not be working over the weekend.    Diagnostic tests  n/a    Patient Stated Goals  "to do things right (with exercise) this time"    Currently in Pain?  Yes    Pain Score  5    with activity   Pain Location  Back    Pain Orientation  Left;Lower    Pain Descriptors / Indicators  Pressure   "pinching"   Pain Type  Chronic pain    Pain Frequency  Intermittent    Aggravating Factors   prolonged sitting or standing    Pain Relieving Factors  bending forward or seated prayer stretch    Pain Score  0    Pain Location  Finger (Comment which one)    Pain Orientation  Left;Right    Pain Descriptors / Indicators  Numbness;Tingling    Pain Type  Chronic pain    Pain Radiating Towards  intermittent L>R numbness and tingling in fingertips    Pain Frequency  Intermittent                       [facility] Adult PT Treatment/Exercise -  03/27/19 0849      Neck Exercises: Machines for Strengthening   Nustep  L4 x 6 min (LE only) - initially attempted with UEs but discontinued after 30 sec d/t onset of L fingertip numbness      Modalities   Modalities  Electrical Stimulation;Moist Heat      Moist Heat Therapy   Number Minutes Moist Heat  15 Minutes    Moist Heat Location  Cervical;Shoulder;Lumbar Spine      Electrical Stimulation   Electrical Stimulation Location  B UT/LS; B lumbar paraspinals at level of SIJ    Electrical Stimulation Action  Pre-Mod    Electrical Stimulation Parameters  intensity to pt tolerance x 15'     Electrical Stimulation Goals  Pain;Tone      Manual Therapy   Manual Therapy  Soft tissue mobilization;Myofascial release    Manual therapy comments  supine & prone    Soft tissue mobilization  STM/DTM to B UT/LS - increased tension/taut bands greater on L than R; STM/DTM to B lumbar paraspinals  Myofascial Release  manual TPR to L UT/LS; pin and stretch to B UT and lumbar paraspinals       Trigger Point Dry Needling - 03/27/19 0849    Consent Given?  Yes    Education Handout Provided  Previously provided    Muscles Treated Head and Neck  Upper trapezius;Levator scapulae    Upper Trapezius Response  Twitch reponse elicited;Palpable increased muscle length   B UT (L>R)   Levator Scapulae Response  Twitch response elicited;Palpable increased muscle length   Left          PT Education - 03/27/19 1000    Education Details  Home TENS/estim unit options    Person(s) Educated  Patient    Methods  Explanation    Comprehension  Verbalized understanding       PT Short Term Goals - 03/25/19 0941      PT SHORT TERM GOAL #1   Title  Patient will be independent with initial HEP    Status  Achieved   03/25/19     PT SHORT TERM GOAL #2   Title  Patient will verbalize/demonstrate good awareness of neutral spine posture and proper body mechanics for daily tasks    Status  Achieved    03/25/19       PT Long Term Goals - 03/16/19 0818      PT LONG TERM GOAL #1   Title  Patient will be independent with ongoing/advanced HEP    Status  On-going    Target Date  04/23/19      PT LONG TERM GOAL #2   Title  Patient to report pain reduction in frequency and intensity by >/= 50%    Status  On-going    Target Date  04/23/19      PT LONG TERM GOAL #3   Title  Patient to improve cervical and lumbar AROM to WNL without pain provocation    Status  On-going    Target Date  04/23/19      PT LONG TERM GOAL #4   Title  Patient will demonstrate improved proximxal UE/LE strength to >/= 4/5 to 4+/5 for improved stabilty and activity tolerance    Status  On-going    Target Date  04/23/19      PT LONG TERM GOAL #5   Title  Patient to report ability to perform ADLs, household and work-related tasks without increased pain    Status  On-going    Target Date  04/23/19            Plan - 03/27/19 0948    Clinical Impression Statement  Increased muscle tension persisting in B UT/LS (L>R) with Yeraldine expressing interest in trying DN today as previously discussed, therefore treatment session today focusing on manual therapy incorporating STM/DTM and MFR along with DN to B UT and L LS upon informed consent from patient as well as STM/DTM to B lumbar paraspinals and L upper glutes. Positive twitch response elicited in UT/LS with decreased muscle tension noted in all muscle treated. Treatment concluded with trial of Pre-Mod estim and moist heat to B UT/LS and B SIJ/lumbar paraspinals to promote further muscle relaxation - patient noting benefit and expressing interest in home estim unit, therefore with her consent with inquire about insurance coverage for home Flex-IT TENS unit.    Personal Factors and Comorbidities  Time since onset of injury/illness/exacerbation;Past/Current Experience;Fitness    Examination-Activity Limitations  Sleep;Sit;Reach Overhead;Lift;Carry;Caring for Others     Examination-Participation Restrictions  Other   exercise   Stability/Clinical Decision Making  Evolving/Moderate complexity    Rehab Potential  Good    PT Frequency  2x / week    PT Duration  6 weeks    PT Treatment/Interventions  ADLs/Self Care Home Management;Cryotherapy;Electrical Stimulation;Iontophoresis 4mg /ml Dexamethasone;Moist Heat;Traction;Ultrasound;Functional mobility training;Therapeutic activities;Therapeutic exercise;Neuromuscular re-education;Patient/family education;Manual techniques;Passive range of motion;Dry needling;Taping;Spinal Manipulations;Joint Manipulations    PT Next Visit Plan  assess response to DN; manual therapy including DN as indicated and benefit noted; review and progress cervical & lumbar HEPs as indicated; postural strengthening; review of posture and body mechanics education including work simulation PRN; modalities PRN    PT Home Exercise Plan  03/12/19 - cervical retraction, UT & LS stretches, backward shoulder rolls; 03/16/19 - HS, SKTC, piriformis (KTOS & figure 4 to chest) stretches, LTR, pelvic tilt; 03/25/19 - yellow TB scap retraction + horiz ABD, ER and UE diagonals in hooklying on pool noodle, standing scap retraction + low row    Consulted and Agree with Plan of Care  Patient       Patient will benefit from skilled therapeutic intervention in order to improve the following deficits and impairments:  Decreased activity tolerance, Decreased knowledge of precautions, Decreased mobility, Decreased range of motion, Decreased strength, Increased fascial restricitons, Increased muscle spasms, Impaired perceived functional ability, Impaired flexibility, Impaired sensation, Impaired UE functional use, Improper body mechanics, Postural dysfunction, Pain  Visit Diagnosis: Cervicalgia  Radiculopathy, cervical region  Chronic left-sided low back pain without sciatica  Abnormal posture  Muscle weakness (generalized)  Muscle spasm of back     Problem  List Patient Active Problem List   Diagnosis Date Noted  . Gastrocnemius tear, right, initial encounter 08/22/2018  . 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 Nonallopathic lesion of cervical region 02/13/2016  . Nonallopathic lesion of thoracic region 02/13/2016  . Nonallopathic lesion of rib cage 02/13/2016    04/12/2016, PT, MPT 03/27/2019, 10:18 AM  [facility] [address]  [address] [place], [place], [place] Phone: [phone]   Fax:  [phone]  Name: CLAUDINE STALLINGS MRN: [name] Date of Birth: August 02, 1970

## 2019-03-30 ENCOUNTER — Encounter: Payer: Self-pay | Admitting: Physical Therapy

## 2019-03-30 ENCOUNTER — Ambulatory Visit: Payer: 59 | Admitting: Physical Therapy

## 2019-03-30 ENCOUNTER — Other Ambulatory Visit: Payer: Self-pay

## 2019-03-30 DIAGNOSIS — G8929 Other chronic pain: Secondary | ICD-10-CM | POA: Diagnosis not present

## 2019-03-30 DIAGNOSIS — M5412 Radiculopathy, cervical region: Secondary | ICD-10-CM | POA: Diagnosis not present

## 2019-03-30 DIAGNOSIS — M545 Low back pain, unspecified: Secondary | ICD-10-CM

## 2019-03-30 DIAGNOSIS — M542 Cervicalgia: Secondary | ICD-10-CM

## 2019-03-30 DIAGNOSIS — R293 Abnormal posture: Secondary | ICD-10-CM

## 2019-03-30 DIAGNOSIS — M62830 Muscle spasm of back: Secondary | ICD-10-CM

## 2019-03-30 DIAGNOSIS — M6281 Muscle weakness (generalized): Secondary | ICD-10-CM

## 2019-03-30 NOTE — Therapy (Signed)
[facility] [address]  [place], [place], [address] Phone: [phone]   Fax:  [phone]  Physical Therapy Treatment  Patient Details  Name: Kaitlyn Bennett MRN: [id number] Date of Birth: [birth year] Referring Provider (PT): Claiborne Billings A. Pamala Hurry, MD   Encounter Date: 03/30/2019  PT End of Session - 03/30/19 1105    Visit Number  6    Number of Visits  12    Date for PT Re-Evaluation  04/23/19    Authorization Type  [facility] - VL: [place]    PT Start Time  1105    PT Stop Time  1155    PT Time Calculation (min)  50 min    Activity Tolerance  Patient tolerated treatment well    Behavior During Therapy  [facility] for tasks assessed/performed       History reviewed. No pertinent past medical history.  Past Surgical History:  Procedure Laterality Date  . CESAREAN SECTION     x2  . OOPHORECTOMY     2000    There were no vitals filed for this visit.  Subjective Assessment - 03/30/19 1107    Subjective  Pt reporting her shoulders feel less tense and tender following DN last session. 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 Denies pain today.    Diagnostic tests  n/a    Patient Stated Goals  "to do things right (with exercise) this time"    Currently in Pain?  No/denies                       [facility] Adult PT Treatment/Exercise - 03/30/19 1105      Neck Exercises: Machines for Strengthening   Nustep  L4 x 6 min (LE only)      Neck Exercises: Theraband   Scapula Retraction  10 reps   double yellow TB   Scapula Retraction Limitations  + alt UE diagonals; standing against pool noodle on wall    Shoulder Extension  15 reps   yellow TB   Shoulder Extension Limitations  emphasis on scap retraction & depression with shoulder extension to neutral    Shoulder External Rotation  10  reps   double yellow TB   Shoulder External Rotation Limitations  cues for scap retraction; standing against pool noodle on wall    Horizontal ABduction  10 reps   double yellow TB   Horizontal ABduction Limitations  cues for scap retraction; standing against pool noodle on wall    Other Theraband Exercises  R/L scapular/shoulder depression with yellow TB 10 x 5"      Neck Exercises: Prone   Other Prone Exercise  POE serratus push-up x 10      Lumbar Exercises: Stretches   Quadruped Mid Back Stretch  30 seconds;3 reps    Quadruped Mid Back Stretch Limitations  center & R/L child's pose in quadruped (onset of L fingertip numbness with R lateral stretch only) + 3-way seated prayer stretch with green Pball (no numbess reported)      Lumbar Exercises: Quadruped   Single Arm Raise  Right;Left;10 reps;2 seconds    Straight Leg Raise  10 reps;2 seconds      Neck Exercises: Stretches   Warehouse manager  30 seconds;3 reps    Corner Stretch Limitations  3-way doorway stretch  PT Short Term Goals - 03/25/19 0941      PT SHORT TERM GOAL #1   Title  Patient will be independent with initial HEP    Status  Achieved   03/25/19     PT SHORT TERM GOAL #2   Title  Patient will verbalize/demonstrate good awareness of neutral spine posture and proper body mechanics for daily tasks    Status  Achieved   03/25/19       PT Long Term Goals - 03/16/19 0818      PT LONG TERM GOAL #1   Title  Patient will be independent with ongoing/advanced HEP    Status  On-going    Target Date  04/23/19      PT LONG TERM GOAL #2   Title  Patient to report pain reduction in frequency and intensity by >/= 50%    Status  On-going    Target Date  04/23/19      PT LONG TERM GOAL #3   Title  Patient to improve cervical and lumbar AROM to WNL without pain provocation    Status  On-going    Target Date  04/23/19      PT LONG TERM GOAL #4   Title  Patient will demonstrate improved proximxal  UE/LE strength to >/= 4/5 to 4+/5 for improved stabilty and activity tolerance    Status  On-going    Target Date  04/23/19      PT LONG TERM GOAL #5   Title  Patient to report ability to perform ADLs, household and work-related tasks without increased pain    Status  On-going    Target Date  04/23/19            Plan - 03/30/19 1155    Clinical Impression Statement  Kaitlyn Bennett reported benefit from DN and manual therapy last session with decreased muscle tension and tenderness in upper traps over the weekend, however still experiencing intermittent L fingertip numbness and tingling. Progressed core and scapular strengthening today transitioning hooklying scapular retraction exercises over pool noodle to standing and addition of quadruped UE and LE extensions. Better tolerance for child's pose but still elicits brief onset of L fingertip numbness and tingling with lateral stretch to R. Patient continues to report limited ability to complete HEP but keeps track of what she has done to ensure good mix of all exercises as she is able to get to HEP. Sessions continue to focus more heavily on upper back/neck and UE radiculopathy as patient not as bothered by LBP currently.    Personal Factors and Comorbidities  Time since onset of injury/illness/exacerbation;Past/Current Experience;Fitness    Examination-Activity Limitations  Sleep;Sit;Reach Overhead;Lift;Carry;Caring for Others    Examination-Participation Restrictions  Other   exercise   Stability/Clinical Decision Making  Evolving/Moderate complexity    Rehab Potential  Good    PT Frequency  2x / week    PT Duration  6 weeks    PT Treatment/Interventions  ADLs/Self Care Home Management;Cryotherapy;Electrical Stimulation;Iontophoresis 4mg /ml Dexamethasone;Moist Heat;Traction;Ultrasound;Functional mobility training;Therapeutic activities;Therapeutic exercise;Neuromuscular re-education;Patient/family education;Manual techniques;Passive range of  motion;Dry needling;Taping;Spinal Manipulations;Joint Manipulations    PT Next Visit Plan  manual therapy including DN as indicated and benefit noted; review and progress cervical & lumbar HEPs as indicated; postural strengthening; review of posture and body mechanics education including work simulation PRN; modalities PRN    PT Home Exercise Plan  03/12/19 - cervical retraction, UT & LS stretches, backward shoulder rolls; 03/16/19 - HS, SKTC, piriformis (KTOS & figure 4  to chest) stretches, LTR, pelvic tilt; 03/25/19 - yellow TB scap retraction + horiz ABD, ER and UE diagonals in hooklying on pool noodle, standing scap retraction + low row    Consulted and Agree with Plan of Care  Patient       Patient will benefit from skilled therapeutic intervention in order to improve the following deficits and impairments:  Decreased activity tolerance, Decreased knowledge of precautions, Decreased mobility, Decreased range of motion, Decreased strength, Increased fascial restricitons, Increased muscle spasms, Impaired perceived functional ability, Impaired flexibility, Impaired sensation, Impaired UE functional use, Improper body mechanics, Postural dysfunction, Pain  Visit Diagnosis: Cervicalgia  Radiculopathy, cervical region  Chronic left-sided low back pain without sciatica  Abnormal posture  Muscle weakness (generalized)  Muscle spasm of back     Problem List Patient Active Problem List   Diagnosis Date Noted  . Gastrocnemius tear, right, initial encounter 08/22/2018  . 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 Nonallopathic lesion of cervical region 02/13/2016  . Nonallopathic lesion of thoracic region 02/13/2016  . Nonallopathic lesion of rib cage 02/13/2016    Marry Guan, PT, MPT 03/30/2019, 12:43 PM  [facility] [address]  [address] [place], [place], [address] Phone: [phone]   Fax:  [phone]  Name: Kaitlyn Bennett MRN: [id number] Date of Birth: 02-Apr-1970

## 2019-04-03 ENCOUNTER — Encounter: Payer: Self-pay | Admitting: Physical Therapy

## 2019-04-03 ENCOUNTER — Ambulatory Visit: Payer: 59 | Admitting: Physical Therapy

## 2019-04-03 ENCOUNTER — Other Ambulatory Visit: Payer: Self-pay

## 2019-04-03 DIAGNOSIS — M62830 Muscle spasm of back: Secondary | ICD-10-CM

## 2019-04-03 DIAGNOSIS — M6281 Muscle weakness (generalized): Secondary | ICD-10-CM

## 2019-04-03 DIAGNOSIS — M545 Low back pain, unspecified: Secondary | ICD-10-CM

## 2019-04-03 DIAGNOSIS — G8929 Other chronic pain: Secondary | ICD-10-CM

## 2019-04-03 DIAGNOSIS — R293 Abnormal posture: Secondary | ICD-10-CM

## 2019-04-03 DIAGNOSIS — M5412 Radiculopathy, cervical region: Secondary | ICD-10-CM | POA: Diagnosis not present

## 2019-04-03 DIAGNOSIS — M542 Cervicalgia: Secondary | ICD-10-CM

## 2019-04-03 NOTE — Patient Instructions (Addendum)
    Home exercise program created by Serenna Deroy, PT.  For questions, please contact Jaekwon Mcclune via phone at [phone] or email at [email or web address]  [facility] [place] [address]  [address] [place], [place], [address] Phone: [phone]   Fax:  [phone]    

## 2019-04-03 NOTE — Therapy (Signed)
[facility] [address]  [address] [place], [place], [address] Phone: [phone]   Fax:  [phone]  Physical Therapy Treatment  Patient Details  Name: Kaitlyn Bennett MRN: [id number] Date of Birth: [birth year] Referring Provider (PT): Tresa Endo A. Ernestina Penna, MD   Encounter Date: 04/03/2019  PT End of Session - 04/03/19 0846    Visit Number  7    Number of Visits  12    Date for PT Re-Evaluation  04/23/19    Authorization Type  [facility] - VL: [place]    PT Start Time  0846    PT Stop Time  0953    PT Time Calculation (min)  67 min    Activity Tolerance  Patient tolerated treatment well    Behavior During Therapy  [facility] for tasks assessed/performed       History reviewed. No pertinent past medical history.  Past Surgical History:  Procedure Laterality Date  . CESAREAN SECTION     x2  . OOPHORECTOMY     2000    There were no vitals filed for this visit.  Subjective Assessment - 04/03/19 0849    Subjective  Pt reports she has been using the doorway pec stretch at work with good relief.    Diagnostic tests  n/a    Patient Stated Goals  "to do things right (with exercise) this time"    Currently in Pain?  No/denies                       [facility] Adult PT Treatment/Exercise - 04/03/19 0846      Neck Exercises: Machines for Strengthening   Nustep  L4 x 6 min (LE only)      Modalities   Modalities  Electrical Stimulation;Moist Heat      Moist Heat Therapy   Number Minutes Moist Heat  15 Minutes    Moist Heat Location  Cervical;Shoulder;Lumbar Spine      Electrical Stimulation   Electrical Stimulation Location  B UT/LS    Electrical Stimulation Action  IFC    Electrical Stimulation Parameters  80-150 Hz, intensity to pt tol x 15'    Electrical Stimulation Goals  Pain;Tone      Manual Therapy   Manual Therapy  Joint mobilization;Soft tissue mobilization;Myofascial release;Passive ROM    Manual therapy  comments  supine    Joint Mobilization  L 1st rib mobilization    Soft tissue mobilization  STM/DTM to B UT/LS and scalenes - increased tension/taut bands greater on L than R    Myofascial Release  manual TPR to L UT/LS; pin and stretch to B UT    Passive ROM  manual stretching for UT, LS & scalenes x 30 sec each      Neck Exercises: Stretches   Other Neck Stretches  L/R 1st rib self mobilization 2 x 30 sec       Trigger Point Dry Needling - 04/03/19 0846    Consent Given?  Yes    Muscles Treated Head and Neck  Upper trapezius;Scalenes   Left   Upper Trapezius Response  Twitch reponse elicited;Palpable increased muscle length    Scalenes Response  Twitch reponse elicited;Palpable increased muscle length           PT Education - 04/03/19 0956    Education Details  HEP update - self 1st rib mob    Person(s) Educated  Patient    Methods  Explanation;Demonstration;Verbal cues;Handout  Comprehension  Verbalized understanding;Returned demonstration;Verbal cues required;Need further instruction       PT Short Term Goals - 03/25/19 0941      PT SHORT TERM GOAL #1   Title  Patient will be independent with initial HEP    Status  Achieved   03/25/19     PT SHORT TERM GOAL #2   Title  Patient will verbalize/demonstrate good awareness of neutral spine posture and proper body mechanics for daily tasks    Status  Achieved   03/25/19       PT Long Term Goals - 03/16/19 0818      PT LONG TERM GOAL #1   Title  Patient will be independent with ongoing/advanced HEP    Status  On-going    Target Date  04/23/19      PT LONG TERM GOAL #2   Title  Patient to report pain reduction in frequency and intensity by >/= 50%    Status  On-going    Target Date  04/23/19      PT LONG TERM GOAL #3   Title  Patient to improve cervical and lumbar AROM to WNL without pain provocation    Status  On-going    Target Date  04/23/19      PT LONG TERM GOAL #4   Title  Patient will demonstrate  improved proximxal UE/LE strength to >/= 4/5 to 4+/5 for improved stabilty and activity tolerance    Status  On-going    Target Date  04/23/19      PT LONG TERM GOAL #5   Title  Patient to report ability to perform ADLs, household and work-related tasks without increased pain    Status  On-going    Target Date  04/23/19            Plan - 04/03/19 0851    Clinical Impression Statement  Kaitlyn Bennett noting she has been performing doorway pec stretch periodically at work and notes some relief with this. Increased tension persisting in upper traps and scalenes, L>R, with apparent 1st rib elevation. Addressed this with manual STM/DTM and TPR along with DN followed by manual stretches and 1st rib mobilization. Education provided for self-1st rib mobilization with belt at home. Treatment concluded with estim and moist heat to promote further muscle relaxation.    Personal Factors and Comorbidities  Time since onset of injury/illness/exacerbation;Past/Current Experience;Fitness    Examination-Activity Limitations  Sleep;Sit;Reach Overhead;Lift;Carry;Caring for Others    Examination-Participation Restrictions  Other   exercise   Stability/Clinical Decision Making  Evolving/Moderate complexity    Rehab Potential  Good    PT Frequency  2x / week    PT Duration  6 weeks    PT Treatment/Interventions  ADLs/Self Care Home Management;Cryotherapy;Electrical Stimulation;Iontophoresis 4mg /ml Dexamethasone;Moist Heat;Traction;Ultrasound;Functional mobility training;Therapeutic activities;Therapeutic exercise;Neuromuscular re-education;Patient/family education;Manual techniques;Passive range of motion;Dry needling;Taping;Spinal Manipulations;Joint Manipulations    PT Next Visit Plan  manual therapy including DN as indicated and benefit noted; review and progress cervical & lumbar HEPs as indicated; postural strengthening; review of posture and body mechanics education including work simulation PRN; modalities PRN     PT Home Exercise Plan  03/12/19 - cervical retraction, UT & LS stretches, backward shoulder rolls; 03/16/19 - HS, SKTC, piriformis (KTOS & figure 4 to chest) stretches, LTR, pelvic tilt; 03/25/19 - yellow TB scap retraction + horiz ABD, ER and UE diagonals in hooklying on pool noodle, standing scap retraction + low row; 04/03/19 - sefl-1st rib mobs    Consulted and Agree  with Plan of Care  Patient       Patient will benefit from skilled therapeutic intervention in order to improve the following deficits and impairments:  Decreased activity tolerance, Decreased knowledge of precautions, Decreased mobility, Decreased range of motion, Decreased strength, Increased fascial restricitons, Increased muscle spasms, Impaired perceived functional ability, Impaired flexibility, Impaired sensation, Impaired UE functional use, Improper body mechanics, Postural dysfunction, Pain  Visit Diagnosis: Cervicalgia  Radiculopathy, cervical region  Chronic left-sided low back pain without sciatica  Abnormal posture  Muscle weakness (generalized)  Muscle spasm of back     Problem List Patient Active Problem List   Diagnosis Date Noted  . Gastrocnemius tear, right, initial encounter 08/22/2018  . 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 Nonallopathic lesion of cervical region 02/13/2016  . Nonallopathic lesion of thoracic region 02/13/2016  . Nonallopathic lesion of rib cage 02/13/2016    Percival Spanish, PT, MPT 04/03/2019, 12:35 PM  [facility] [address]  [place], [place], [address] Phone: [phone]   Fax:  [phone]  Name: Kaitlyn Bennett MRN: [id number] Date of Birth: Sep 12, 1970

## 2019-04-09 ENCOUNTER — Other Ambulatory Visit: Payer: Self-pay

## 2019-04-09 ENCOUNTER — Ambulatory Visit: Payer: 59 | Attending: Obstetrics | Admitting: Physical Therapy

## 2019-04-09 ENCOUNTER — Encounter: Payer: Self-pay | Admitting: Physical Therapy

## 2019-04-09 DIAGNOSIS — M542 Cervicalgia: Secondary | ICD-10-CM | POA: Diagnosis not present

## 2019-04-09 DIAGNOSIS — R293 Abnormal posture: Secondary | ICD-10-CM | POA: Insufficient documentation

## 2019-04-09 DIAGNOSIS — M62830 Muscle spasm of back: Secondary | ICD-10-CM | POA: Diagnosis not present

## 2019-04-09 DIAGNOSIS — M5412 Radiculopathy, cervical region: Secondary | ICD-10-CM | POA: Diagnosis not present

## 2019-04-09 DIAGNOSIS — M6281 Muscle weakness (generalized): Secondary | ICD-10-CM | POA: Insufficient documentation

## 2019-04-09 DIAGNOSIS — G8929 Other chronic pain: Secondary | ICD-10-CM | POA: Diagnosis not present

## 2019-04-09 DIAGNOSIS — M545 Low back pain: Secondary | ICD-10-CM | POA: Insufficient documentation

## 2019-04-09 NOTE — Therapy (Addendum)
[facility] [address]  [address] [place], [place], [address] Phone: [phone]   Fax:  [phone]  Physical Therapy Treatment  Patient Details  Name: SHAWNDELL Bennett MRN: [id number] Date of Birth: August 18, 1970 Referring Provider (PT): Tresa Endo A. Ernestina Penna, MD   Encounter Date: 04/09/2019  PT End of Session - 04/09/19 0845    Visit Number  8    Number of Visits  12    Date for PT Re-Evaluation  04/23/19    Authorization Type  [facility] - VL: [place]    PT Start Time  0945    PT Stop Time  1107    PT Time Calculation (min)  82 min    Activity Tolerance  Patient tolerated treatment well    Behavior During Therapy  [facility] for tasks assessed/performed       History reviewed. No pertinent past medical history.  Past Surgical History:  Procedure Laterality Date  . CESAREAN SECTION     x2  . OOPHORECTOMY     2000    There were no vitals filed for this visit.  Subjective Assessment - 04/09/19 0949    Subjective  Pt reporting she has now been having numbness in B fingertips since Monday with any activties where her arms are above chest/shoulder height.    Diagnostic tests  n/a    Patient Stated Goals  "to do things right (with exercise) this time"    Currently in Pain?  No/denies                       [facility] Adult PT Treatment/Exercise - 04/09/19 0945      Neck Exercises: Machines for Strengthening   UBE (Upper Arm Bike)  attempted but onset on numbness w/in first few seconds, therefore deferred.    Nustep  L5 x 6 min (LE only)      Manual Therapy   Manual Therapy  Joint mobilization;Soft tissue mobilization;Myofascial release;Passive ROM;Manual Traction;Taping    Manual therapy comments  supine & seated    Joint Mobilization  B 1st rib mobilization    Soft tissue mobilization  STM/DTM to B UT/LS and scalenes - increased tension/taut bands greater on L than R    Myofascial Release  manual TPR to B UT/LS; pin and  stretch to B UT    Passive ROM  manual stretching for UT, LS & scalenes x 30 sec each    Manual Traction  gentle cervical distraction attempted but increased numbness/tingling - resolved upon release of traction    Kinesiotex  Inhibit Muscle      Kinesiotix   Inhibit Muscle   B UT/LS/scalenes 30-50%       Trigger Point Dry Needling - 04/09/19 0945    Muscles Treated Head and Neck  Upper trapezius;Scalenes   Bilateral   Upper Trapezius Response  Twitch reponse elicited;Palpable increased muscle length    Scalenes Response  Twitch reponse elicited;Palpable increased muscle length           PT Education - 04/09/19 1107    Education Details  Role of kinesiotaping and wearing instructions; Set-up and use of home Flex-IT TENS unit    Person(s) Educated  Patient    Methods  Explanation;Demonstration;Handout    Comprehension  Verbalized understanding;Returned demonstration       PT Short Term Goals - 03/25/19 0941      PT SHORT TERM GOAL #1   Title  Patient will be independent  with initial HEP    Status  Achieved   03/25/19     PT SHORT TERM GOAL #2   Title  Patient will verbalize/demonstrate good awareness of neutral spine posture and proper body mechanics for daily tasks    Status  Achieved   03/25/19       PT Long Term Goals - 03/16/19 0818      PT LONG TERM GOAL #1   Title  Patient will be independent with ongoing/advanced HEP    Status  On-going    Target Date  04/23/19      PT LONG TERM GOAL #2   Title  Patient to report pain reduction in frequency and intensity by >/= 50%    Status  On-going    Target Date  04/23/19      PT LONG TERM GOAL #3   Title  Patient to improve cervical and lumbar AROM to WNL without pain provocation    Status  On-going    Target Date  04/23/19      PT LONG TERM GOAL #4   Title  Patient will demonstrate improved proximxal UE/LE strength to >/= 4/5 to 4+/5 for improved stabilty and activity tolerance    Status  On-going    Target  Date  04/23/19      PT LONG TERM GOAL #5   Title  Patient to report ability to perform ADLs, household and work-related tasks without increased pain    Status  On-going    Target Date  04/23/19            Plan - 04/09/19 0952    Clinical Impression Statement  Jaelyne denies pain today but reports worsening of numbness and tingling over past week, now triggered by almost all activities where she has to reach above chest/shoulder height and becoming more prominent in R fingertips where previously more focused on L. She states HEP stretches and exercises will also trigger numbness/tingling but unable to isolate which stretches/exercises other than overhead doorway pec stretch - patient instructed to defer overhead position for this stretch and try to be aware of what other stretches/exercises are also problematic. Cervical distraction making symptoms worse, therefore unlikely nerve compression at spinal level but may be related to brachial plexus compression/traction as symptoms worsen with UE elevation. Excessive muscle tension/taut bands present in B upper traps and scalenes (L>R) today, therefore session initially focusing on manual therapy incorporating DN along with manual stretching and 1st rib mobilization to reduce pressure on brachial plexus. Initiated trial of kinesiotaping to B UT/LS/scalenes to promote further reduction in muscle tension. Instruction in set-up and use of home Flex-IT TENS unit also provided with unit issued to patient with her consent after verification of insurance coverage and OOP cost.    Personal Factors and Comorbidities  Time since onset of injury/illness/exacerbation;Past/Current Experience;Fitness    Examination-Activity Limitations  Sleep;Sit;Reach Overhead;Lift;Carry;Caring for Others    Examination-Participation Restrictions  Other   exercise   Stability/Clinical Decision Making  Evolving/Moderate complexity    Rehab Potential  Good    PT Frequency  2x /  week    PT Duration  6 weeks    PT Treatment/Interventions  ADLs/Self Care Home Management;Cryotherapy;Electrical Stimulation;Iontophoresis 4mg /ml Dexamethasone;Moist Heat;Traction;Ultrasound;Functional mobility training;Therapeutic activities;Therapeutic exercise;Neuromuscular re-education;Patient/family education;Manual techniques;Passive range of motion;Dry needling;Taping;Spinal Manipulations;Joint Manipulations    PT Next Visit Plan  manual therapy including DN as indicated and benefit noted; review and progress cervical & lumbar HEPs as indicated; postural strengthening; review of posture and body  mechanics education including work simulation PRN; modalities PRN    PT Home Exercise Plan  03/12/19 - cervical retraction, UT & LS stretches, backward shoulder rolls; 03/16/19 - HS, SKTC, piriformis (KTOS & figure 4 to chest) stretches, LTR, pelvic tilt; 03/25/19 - yellow TB scap retraction + horiz ABD, ER and UE diagonals in hooklying on pool noodle, standing scap retraction + low row; 04/03/19 - sefl-1st rib mobs    Consulted and Agree with Plan of Care  Patient       Patient will benefit from skilled therapeutic intervention in order to improve the following deficits and impairments:  Decreased activity tolerance, Decreased knowledge of precautions, Decreased mobility, Decreased range of motion, Decreased strength, Increased fascial restricitons, Increased muscle spasms, Impaired perceived functional ability, Impaired flexibility, Impaired sensation, Impaired UE functional use, Improper body mechanics, Postural dysfunction, Pain  Visit Diagnosis: Cervicalgia  Radiculopathy, cervical region  Chronic left-sided low back pain without sciatica  Abnormal posture  Muscle weakness (generalized)  Muscle spasm of back     Problem List Patient Active Problem List   Diagnosis Date Noted  . Gastrocnemius tear, right, initial encounter 08/22/2018  . 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 Nonallopathic  lesion of cervical region 02/13/2016  . Nonallopathic lesion of thoracic region 02/13/2016  . Nonallopathic lesion of rib cage 02/13/2016    Marry Guan, PT, MPT 04/09/2019, 11:46 AM  [facility] [address]  [address] [place], [place], [address] Phone: [phone]   Fax:  [phone]  Name: MACHELL WIRTHLIN MRN: [id number] Date of Birth: Aug 01, 1970

## 2019-04-10 ENCOUNTER — Encounter: Payer: 59 | Admitting: Physical Therapy

## 2019-04-13 ENCOUNTER — Ambulatory Visit: Payer: 59 | Admitting: Physical Therapy

## 2019-04-14 ENCOUNTER — Encounter: Payer: 59 | Admitting: Physical Therapy

## 2019-04-23 ENCOUNTER — Ambulatory Visit: Payer: 59 | Admitting: Physical Therapy

## 2019-04-23 DIAGNOSIS — M542 Cervicalgia: Secondary | ICD-10-CM | POA: Diagnosis not present

## 2019-04-29 ENCOUNTER — Ambulatory Visit: Payer: 59 | Admitting: Physical Therapy

## 2019-04-29 ENCOUNTER — Other Ambulatory Visit: Payer: Self-pay

## 2019-04-29 ENCOUNTER — Encounter: Payer: Self-pay | Admitting: Physical Therapy

## 2019-04-29 DIAGNOSIS — G8929 Other chronic pain: Secondary | ICD-10-CM

## 2019-04-29 DIAGNOSIS — M5412 Radiculopathy, cervical region: Secondary | ICD-10-CM

## 2019-04-29 DIAGNOSIS — R293 Abnormal posture: Secondary | ICD-10-CM

## 2019-04-29 DIAGNOSIS — M545 Low back pain, unspecified: Secondary | ICD-10-CM

## 2019-04-29 DIAGNOSIS — M542 Cervicalgia: Secondary | ICD-10-CM

## 2019-04-29 DIAGNOSIS — M6281 Muscle weakness (generalized): Secondary | ICD-10-CM

## 2019-04-29 DIAGNOSIS — M62830 Muscle spasm of back: Secondary | ICD-10-CM | POA: Diagnosis not present

## 2019-04-29 NOTE — Therapy (Addendum)
[facility] [address]  [place], [place], [address] Phone: [phone]   Fax:  [phone]  Physical Therapy Treatment / Discharge Summary  Patient Details  Name: Kaitlyn Bennett MRN: [id number] Date of Birth: April 21, 1970 Referring Provider (PT): Claiborne Billings A. Pamala Hurry, MD   Encounter Date: 04/29/2019  PT End of Session - 04/29/19 0930    Visit Number  9    Number of Visits  12    Date for PT Re-Evaluation  04/23/19    Authorization Type  [facility] - VL: [place]    PT Start Time  0930    PT Stop Time  1019    PT Time Calculation (min)  49 min    Activity Tolerance  Patient tolerated treatment well    Behavior During Therapy  [facility] for tasks assessed/performed       History reviewed. No pertinent past medical history.  Past Surgical History:  Procedure Laterality Date  . CESAREAN SECTION     x2  . OOPHORECTOMY     2000    There were no vitals filed for this visit.  Subjective Assessment - 04/29/19 0932    Subjective  Pt requesting today be her discharge day. She reports pain, numbness and tingling have been lessening and she is now exercising 5 days a week.    Diagnostic tests  n/a    Patient Stated Goals  "to do things right (with exercise) this time"    Currently in Pain?  No/denies         [facility] PT Assessment - 04/29/19 0930      Assessment   Medical Diagnosis  Back & neck pain    Referring Provider (PT)  Claiborne Billings A. Pamala Hurry, MD    Onset Date/Surgical Date  --   back - July 2020; neck - 2018   Hand Dominance  Right    Next MD Visit  none scheduled      Observation/Other Assessments   Focus on Therapeutic Outcomes (FOTO)   Neck - 74% (26% limitation)      AROM   Cervical Flexion  50    Cervical Extension  64    Cervical - Right Side Bend  40    Cervical - Left Side Bend  39    Cervical - Right Rotation  69    Cervical - Left Rotation  80    Lumbar Flexion  WFL    Lumbar Extension  WNL    Lumbar -  Right Side Bend  [facility]    Lumbar - Left Side Bend  [facility]    Lumbar - Right Rotation  WNL    Lumbar - Left Rotation  WNL      Strength   Right Shoulder Flexion  5/5    Right Shoulder ABduction  5/5    Right Shoulder Internal Rotation  5/5    Right Shoulder External Rotation  5/5    Left Shoulder Flexion  5/5    Left Shoulder ABduction  5/5    Left Shoulder Internal Rotation  5/5    Left Shoulder External Rotation  4+/5    Right Hip Flexion  5/5    Right Hip Extension  4+/5    Right Hip External Rotation   4+/5    Right Hip Internal Rotation  5/5    Right Hip ABduction  4/5    Right Hip ADduction  4/5    Left Hip Flexion  5/5  Left Hip Extension  4+/5    Left Hip External Rotation  4+/5    Left Hip Internal Rotation  5/5    Left Hip ABduction  4/5    Left Hip ADduction  4/5    Right Knee Flexion  4+/5    Right Knee Extension  5/5    Left Knee Flexion  4+/5    Left Knee Extension  5/5                   [facility] Adult PT Treatment/Exercise - 04/29/19 0930      Neck Exercises: Machines for Strengthening   Nustep  L5 x 6 min (LE only)      Lumbar Exercises: Supine   Bridge  10 reps;5 seconds    Bridge Limitations  + red TB hip ABD isometric      Lumbar Exercises: Sidelying   Clam  Right;Left;10 reps;3 seconds    Clam Limitations  red TB      Knee/Hip Exercises: Standing   Hip Flexion  Right;Left;10 reps;Knee straight;Stengthening    Hip Flexion Limitations  red TB - cues for abd bracing, intermittent UE support on back of chair    Hip ADduction  Right;Left;10 reps;Strengthening    Hip ADduction Limitations  red TB - cues for abd bracing, intermittent UE support on back of chair    Hip Abduction  Right;Left;10 reps;Knee straight;Stengthening    Abduction Limitations  red TB - cues for abd bracing, intermittent UE support on back of chair    Hip Extension  Right;Left;10 reps;Knee straight;Stengthening    Extension Limitations  red TB - cues for abd bracing,  intermittent UE support on back of chair             PT Education - 04/29/19 1009    Education Details  HEP review & update - progression of UE resistance to red TB; addition of LE/core strengthening    Person(s) Educated  Patient    Methods  Explanation;Demonstration;Handout    Comprehension  Verbalized understanding;Returned demonstration       PT Short Term Goals - 03/25/19 0941      PT SHORT TERM GOAL #1   Title  Patient will be independent with initial HEP    Status  Achieved   03/25/19     PT SHORT TERM GOAL #2   Title  Patient will verbalize/demonstrate good awareness of neutral spine posture and proper body mechanics for daily tasks    Status  Achieved   03/25/19       PT Long Term Goals - 04/29/19 0937      PT LONG TERM GOAL #1   Title  Patient will be independent with ongoing/advanced HEP    Status  Achieved   04/29/19     PT LONG TERM GOAL #2   Title  Patient to report pain reduction in frequency and intensity by >/= 50%    Status  Achieved   04/29/19 - 70-80% improvement     PT LONG TERM GOAL #3   Title  Patient to improve cervical and lumbar AROM to WNL without pain provocation    Status  Achieved   04/29/19     PT LONG TERM GOAL #4   Title  Patient will demonstrate improved proximxal UE/LE strength to >/= 4/5 to 4+/5 for improved stabilty and activity tolerance    Status  Achieved   04/29/19     PT LONG TERM GOAL #5   Title  Patient  to report ability to perform ADLs, household and work-related tasks without increased pain    Status  Achieved   04/29/19           Plan - 04/29/19 0935    Clinical Impression Statement  Kaitlyn Bennett reports pain and radicular numbness/tingling have been improving over last 2 weeks since last PT visit as she has increased her home exercise routine, noting 70-80% improvement since start of PT,  and is requesting to formally transition to the HEP at this point. Her cervical and lumbar ROM are now WFL/WNL and her B UE/LE  strength is now grossly 4/5 to 5/5 with patient reporting good comfort with ongoing HEP for continued strengthening. She reports she still experience some intermittent numbness/tingling in her fingers with heavy resistance activities or sustained overhead activity but no longer limited with typical daily household and work tasks. All goals now met and FOTO has exceeded predicted level at only 26% limitation, therefore will proceed with transition to HEP at this time. Kaitlyn Bennett requesting to remain on hold for 30 days to allow option to return to PT if issues arise with HEP.    Rehab Potential  Good    PT Treatment/Interventions  ADLs/Self Care Home Management;Cryotherapy;Electrical Stimulation;Iontophoresis 25m/ml Dexamethasone;Moist Heat;Traction;Ultrasound;Functional mobility training;Therapeutic activities;Therapeutic exercise;Neuromuscular re-education;Patient/family education;Manual techniques;Passive range of motion;Dry needling;Taping;Spinal Manipulations;Joint Manipulations    PT Next Visit Plan  30-day hold    PT Home Exercise Plan  03/12/19 - cervical retraction, UT & LS stretches, backward shoulder rolls; 03/16/19 - HS, SKTC, piriformis (KTOS & figure 4 to chest) stretches, LTR, pelvic tilt; 03/25/19 - yellow TB scap retraction + horiz ABD, ER and UE diagonals in hooklying on pool noodle, standing scap retraction + low row; 04/03/19 - sefl-1st rib mobs; 04/29/19 - red TB side clam & hip ABD isometric bridge, standing red TB 4-way SLR    Consulted and Agree with Plan of Care  Patient       Patient will benefit from skilled therapeutic intervention in order to improve the following deficits and impairments:  Decreased activity tolerance, Decreased knowledge of precautions, Decreased mobility, Decreased range of motion, Decreased strength, Increased fascial restricitons, Increased muscle spasms, Impaired perceived functional ability, Impaired flexibility, Impaired sensation, Impaired UE functional use,  Improper body mechanics, Postural dysfunction, Pain  Visit Diagnosis: Cervicalgia  Radiculopathy, cervical region  Chronic left-sided low back pain without sciatica  Abnormal posture  Muscle weakness (generalized)  Muscle spasm of back     Problem List Patient Active Problem List   Diagnosis Date Noted  . Gastrocnemius tear, right, initial encounter 08/22/2018  . 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 Nonallopathic lesion of cervical region 02/13/2016  . Nonallopathic lesion of thoracic region 02/13/2016  . Nonallopathic lesion of rib cage 02/13/2016    JPercival Spanish PT, MPT 04/29/2019, 1:45 PM  [facility] [place] [address]Phone: [phone]  Fax:  [phone] Name: LYENNY KOSAMRN: [id number] of Birth: [birth year]  PHYSICAL THERAPY DISCHARGE SUMMARY  Visits from Start of Care: 9  Current functional level related to goals / functional outcomes:   Refer to above clinical impression for status as of last visit on 04/29/2019. Patient was placed on hold for 30 days and has not needed to return to PT, therefore will proceed with discharge from PT for this episode.   Remaining deficits:   As above.   Education / Equipment:  HEP, posture and body mechancis  Plan: Patient agrees to discharge.  Patient goals were met. Patient is being discharged due to meeting the stated rehab goals.  ?????     Percival Spanish, PT, MPT 06/17/19, 8:48 AM  [facility] [address]  [place], [place], [address] Phone: [phone]   Fax:  [phone]

## 2019-04-29 NOTE — Patient Instructions (Signed)
    Home exercise program created by JoAnne Kreis, PT.  For questions, please contact JoAnne via phone at [phone] or email at [email or web address]  [facility] [place] [address]  [address] [place], [place], [address] Phone: [phone]   Fax:  [phone]    

## 2019-05-01 ENCOUNTER — Encounter: Payer: Self-pay | Admitting: Physical Therapy

## 2019-05-24 DIAGNOSIS — M542 Cervicalgia: Secondary | ICD-10-CM | POA: Diagnosis not present

## 2019-05-29 ENCOUNTER — Other Ambulatory Visit: Payer: Self-pay

## 2019-05-29 ENCOUNTER — Encounter: Payer: Self-pay | Admitting: Family Medicine

## 2019-05-29 ENCOUNTER — Ambulatory Visit: Payer: 59 | Admitting: Family Medicine

## 2019-05-29 VITALS — BP 128/90 | HR 79 | Temp 97.2°F | Ht 59.0 in | Wt 140.0 lb

## 2019-05-29 DIAGNOSIS — R030 Elevated blood-pressure reading, without diagnosis of hypertension: Secondary | ICD-10-CM

## 2019-05-29 MED ORDER — LOSARTAN POTASSIUM 25 MG PO TABS: 12.5000 mg | ORAL_TABLET | Freq: Every day | ORAL | 3 refills | Status: DC

## 2019-05-29 MED FILL — LOSARTAN POTASSIUM 25 MG TA: 25 | 90 days supply | Qty: 45 | Fill #0

## 2019-05-29 NOTE — Progress Notes (Signed)
Kaitlyn Bennett is a 49 y.o. female  Chief Complaint  Patient presents with  . Follow-up    Blood pressure-pt concerned of BP findings been high for 3 weeks-highest was 156/98//bottom number staying around 90's//    HPI: Kaitlyn Bennett is a 49 y.o. female is seen today to discuss concerns about her blood pressure. She does not have a dx of HTN nor is she on medication for it.  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  Pt states for the past 3 wks her DBP has been 90-98. SBP 150's. She started checking it due intermittent "stabbing pain" in her neck at rest. She does not have home BP machine here with her today. Pt took 1/2 tab of pts losartan/HCTZ and BP was 114/78.  She has not been eating pork, red meat.   fam h/o HTN - father  History reviewed. No pertinent past medical history.  Past Surgical History:  Procedure Laterality Date  . CESAREAN SECTION     x2  . OOPHORECTOMY     2000    Social History   Socioeconomic History  . Marital status: Married    Spouse name: [name]  . Number of children: 2  . Years of education: Nursing  . Highest education level: Not on file  Occupational History  . Occupation: Optician, dispensing: [employer]  . Occupation: Optician, dispensing: [employer]  Tobacco Use  . Smoking status: Never Smoker  . Smokeless tobacco: Never Used  Substance and Sexual Activity  . Alcohol use: No    Alcohol/week: 0.0 standard drinks  . Drug use: No  . Sexual activity: Yes    Partners: Male    Birth control/protection: None  Other Topics Concern  . Not on file  Social History Narrative   Lives with husband, [name] and two children.    Social Determinants of Health   Financial Resource Strain:   . Difficulty of Paying Living Expenses:   Food Insecurity:   . Worried About Charity fundraiser in the Last Year:   . Arboriculturist in the Last Year:   Transportation Needs:   . Lexicographer (Medical):   Marland Kitchen Lack of Transportation (Non-Medical):   Physical Activity:   . Days of Exercise per Week:   . Minutes of Exercise per Session:   Stress:   . Feeling of Stress :   Social Connections:   . Frequency of Communication with Friends and Family:   . Frequency of Social Gatherings with Friends and Family:   . Attends Religious Services:   . Active Member of Clubs or Organizations:   . Attends Archivist Meetings:   Marland Kitchen Marital Status:   Intimate Partner Violence:   . Fear of Current or Ex-Partner:   . Emotionally Abused:   Marland Kitchen Physically Abused:   . Sexually Abused:     Family History  Problem Relation Age of Onset  . Hypertension Father      Immunization History  Administered Date(s) Administered  . Influenza-Unspecified 10/07/2015  . Tdap 10/07/2010    Outpatient Encounter Medications as of 05/29/2019  Medication Sig  . Ascorbic Acid (VITAMIN C PO) Take by mouth.  . B Complex Vitamins (B-COMPLEX/B-12) TABS Take by mouth.  . Calcium Carbonate-Vit D-Min (CALTRATE PLUS PO) Take by mouth.  . TURMERIC PO Take by mouth.   No facility-administered encounter medications on file  as of 05/29/2019.     ROS: Pertinent positives and negatives noted in HPI. Remainder of ROS non-contributory    No Known Allergies  BP 128/90   Pulse 79   Temp (!) 97.2 F (36.2 C) (Tympanic)   Ht 4\' 11"  (1.499 m)   Wt 140 lb (63.5 kg)   SpO2 97%   BMI 28.28 kg/m    BP Readings from Last 3 Encounters:  05/29/19 128/90  08/28/18 136/82  08/22/18 120/76   Pulse Readings from Last 3 Encounters:  05/29/19 79  08/28/18 76  08/22/18 100   Wt Readings from Last 3 Encounters:  05/29/19 140 lb (63.5 kg)  08/28/18 141 lb (64 kg)  08/22/18 141 lb (64 kg)     Physical Exam  Constitutional: She is oriented to person, place, and time. She appears well-developed and well-nourished. No distress.  Cardiovascular: Normal rate, regular rhythm, normal heart sounds  and intact distal pulses.  Pulmonary/Chest: Effort normal and breath sounds normal. No respiratory distress.  Musculoskeletal:        General: No edema.  Neurological: She is alert and oriented to person, place, and time.  Psychiatric: She has a normal mood and affect. Her behavior is normal.     A/P:  1. Elevated blood pressure reading - home BP machine x 2 with elevated readings x 3 wks, not consistently - she does not have home BP machine with her here today - pt has headache and neck pain with elevated readings Rx: - losartan (COZAAR) 25 MG tablet; Take 0.5 tablets (12.5 mg total) by mouth daily.  Dispense: 45 tablet; Refill: 3 - pt will continue to check BP and send readings in 2 wks Discussed plan and reviewed medications with patient, including risks, benefits, and potential side effects. Pt expressed understand. All questions answered.   This visit occurred during the SARS-CoV-2 public health emergency.  Safety protocols were in place, including screening questions prior to the visit, additional usage of staff PPE, and extensive cleaning of exam room while observing appropriate contact time as indicated for disinfecting solutions.

## 2019-06-23 DIAGNOSIS — M542 Cervicalgia: Secondary | ICD-10-CM | POA: Diagnosis not present

## 2019-07-02 ENCOUNTER — Encounter: Payer: Self-pay | Admitting: Family Medicine

## 2019-07-07 ENCOUNTER — Encounter: Payer: Self-pay | Admitting: Family Medicine

## 2019-07-09 ENCOUNTER — Other Ambulatory Visit: Payer: Self-pay

## 2019-07-10 ENCOUNTER — Ambulatory Visit: Payer: 59 | Admitting: Family Medicine

## 2019-07-10 ENCOUNTER — Encounter: Payer: Self-pay | Admitting: Family Medicine

## 2019-07-10 ENCOUNTER — Other Ambulatory Visit: Payer: Self-pay | Admitting: Family Medicine

## 2019-07-10 VITALS — BP 130/90 | HR 72 | Temp 97.7°F | Ht 59.0 in | Wt 142.4 lb

## 2019-07-10 DIAGNOSIS — I10 Essential (primary) hypertension: Secondary | ICD-10-CM

## 2019-07-10 MED ORDER — AMLODIPINE BESYLATE 5 MG PO TABS: 5.0000 mg | ORAL_TABLET | Freq: Every day | ORAL | 3 refills | Status: DC

## 2019-07-10 MED FILL — AMLODIPINE BESYLATE 5 MG TA: 5 | 90 days supply | Qty: 90 | Fill #0

## 2019-07-10 NOTE — Patient Instructions (Addendum)
Goal is average readings <140 / < 90 Check your blood 1/xday at most, even every other day is fine Wait at least 1hr after taking med to check BP Send me a log of your BP readings in 1.5-2 weeks via MyChart  DASH Eating Plan DASH stands for "Dietary Approaches to Stop Hypertension." The DASH eating plan is a healthy eating plan that has been shown to reduce high blood pressure (hypertension). It may also reduce your risk for type 2 diabetes, heart disease, and stroke. The DASH eating plan may also help with weight loss. What are tips for following this plan?  General guidelines  Avoid eating more than 2,300 mg (milligrams) of salt (sodium) a day. If you have hypertension, you may need to reduce your sodium intake to 1,500 mg a day.  Limit alcohol intake to no more than 1 drink a day for nonpregnant women and 2 drinks a day for men. One drink equals 12 oz of beer, 5 oz of wine, or 1 oz of hard liquor.  Work with your health care provider to maintain a healthy body weight or to lose weight. Ask what an ideal weight is for you.  Get at least 30 minutes of exercise that causes your heart to beat faster (aerobic exercise) most days of the week. Activities may include walking, swimming, or biking.  Work with your health care provider or diet and nutrition specialist (dietitian) to adjust your eating plan to your individual calorie needs. Reading food labels   Check food labels for the amount of sodium per serving. Choose foods with less than 5 percent of the Daily Value of sodium. Generally, foods with less than 300 mg of sodium per serving fit into this eating plan.  To find whole grains, look for the word "whole" as the first word in the ingredient list. Shopping  Buy products labeled as "low-sodium" or "no salt added."  Buy fresh foods. Avoid canned foods and premade or frozen meals. Cooking  Avoid adding salt when cooking. Use salt-free seasonings or herbs instead of table salt or sea  salt. Check with your health care provider or pharmacist before using salt substitutes.  Do not fry foods. Cook foods using healthy methods such as baking, boiling, grilling, and broiling instead.  Cook with heart-healthy oils, such as olive, canola, soybean, or sunflower oil. Meal planning  Eat a balanced diet that includes: ? 5 or more servings of fruits and vegetables each day. At each meal, try to fill half of your plate with fruits and vegetables. ? Up to 6-8 servings of whole grains each day. ? Less than 6 oz of lean meat, poultry, or fish each day. A 3-oz serving of meat is about the same size as a deck of cards. One egg equals 1 oz. ? 2 servings of low-fat dairy each day. ? A serving of nuts, seeds, or beans 5 times each week. ? Heart-healthy fats. Healthy fats called Omega-3 fatty acids are found in foods such as flaxseeds and coldwater fish, like sardines, salmon, and mackerel.  Limit how much you eat of the following: ? Canned or prepackaged foods. ? Food that is high in trans fat, such as fried foods. ? Food that is high in saturated fat, such as fatty meat. ? Sweets, desserts, sugary drinks, and other foods with added sugar. ? Full-fat dairy products.  Do not salt foods before eating.  Try to eat at least 2 vegetarian meals each week.  Eat more home-cooked food and  less restaurant, buffet, and fast food.  When eating at a restaurant, ask that your food be prepared with less salt or no salt, if possible. What foods are recommended? The items listed may not be a complete list. Talk with your dietitian about what dietary choices are best for you. Grains Whole-grain or whole-wheat bread. Whole-grain or whole-wheat pasta. Brown rice. [place]. Bulgur. Whole-grain and low-sodium cereals. Pita bread. Low-fat, low-sodium crackers. Whole-wheat flour tortillas. Vegetables Fresh or frozen vegetables (raw, steamed, roasted, or grilled). Low-sodium or reduced-sodium tomato  and vegetable juice. Low-sodium or reduced-sodium tomato sauce and tomato paste. Low-sodium or reduced-sodium canned vegetables. Fruits All fresh, dried, or frozen fruit. Canned fruit in natural juice (without added sugar). Meat and other protein foods Skinless chicken or [place]. Ground chicken or [place]. Pork with fat trimmed off. Fish and seafood. Egg whites. Dried beans, peas, or lentils. Unsalted nuts, nut butters, and seeds. Unsalted canned beans. Lean cuts of beef with fat trimmed off. Low-sodium, lean deli meat. Dairy Low-fat (1%) or fat-free (skim) milk. Fat-free, low-fat, or reduced-fat cheeses. Nonfat, low-sodium ricotta or cottage cheese. Low-fat or nonfat yogurt. Low-fat, low-sodium cheese. Fats and oils Soft margarine without trans fats. Vegetable oil. Low-fat, reduced-fat, or light mayonnaise and salad dressings (reduced-sodium). Canola, safflower, olive, soybean, and sunflower oils. Avocado. Seasoning and other foods Herbs. Spices. Seasoning mixes without salt. Unsalted popcorn and pretzels. Fat-free sweets. What foods are not recommended? The items listed may not be a complete list. Talk with your dietitian about what dietary choices are best for you. Grains Baked goods made with fat, such as croissants, muffins, or some breads. Dry pasta or rice meal packs. Vegetables Creamed or fried vegetables. Vegetables in a cheese sauce. Regular canned vegetables (not low-sodium or reduced-sodium). Regular canned tomato sauce and paste (not low-sodium or reduced-sodium). Regular tomato and vegetable juice (not low-sodium or reduced-sodium). Angie Fava. Olives. Fruits Canned fruit in a light or heavy syrup. Fried fruit. Fruit in cream or butter sauce. Meat and other protein foods Fatty cuts of meat. Ribs. Fried meat. Berniece Salines. Sausage. Bologna and other processed lunch meats. Salami. Fatback. Hotdogs. Bratwurst. Salted nuts and seeds. Canned beans with added salt. Canned or smoked fish. Whole eggs  or egg yolks. Chicken or [place] with skin. Dairy Whole or 2% milk, cream, and half-and-half. Whole or full-fat cream cheese. Whole-fat or sweetened yogurt. Full-fat cheese. Nondairy creamers. Whipped toppings. Processed cheese and cheese spreads. Fats and oils Butter. Stick margarine. Lard. Shortening. Ghee. Bacon fat. Tropical oils, such as coconut, palm kernel, or palm oil. Seasoning and other foods Salted popcorn and pretzels. Onion salt, garlic salt, seasoned salt, table salt, and sea salt. Worcestershire sauce. Tartar sauce. Barbecue sauce. Teriyaki sauce. Soy sauce, including reduced-sodium. Steak sauce. Canned and packaged gravies. Fish sauce. Oyster sauce. Cocktail sauce. Horseradish that you find on the shelf. Ketchup. Mustard. Meat flavorings and tenderizers. Bouillon cubes. Hot sauce and Tabasco sauce. Premade or packaged marinades. Premade or packaged taco seasonings. Relishes. Regular salad dressings. Where to find more information:  National Heart, Lung, and [place]: https://wilson-eaton.com/  American Heart Association: www.heart.org Summary  The DASH eating plan is a healthy eating plan that has been shown to reduce high blood pressure (hypertension). It may also reduce your risk for type 2 diabetes, heart disease, and stroke.  With the DASH eating plan, you should limit salt (sodium) intake to 2,300 mg a day. If you have hypertension, you may need to reduce your sodium intake to 1,500 mg a day.  When on the DASH eating plan, aim to eat more fresh fruits and vegetables, whole grains, lean proteins, low-fat dairy, and heart-healthy fats.  Work with your health care provider or diet and nutrition specialist (dietitian) to adjust your eating plan to your individual calorie needs. This information is not intended to replace advice given to you by your health care provider. Make sure you discuss any questions you have with your health care provider. Document Revised: 01/04/2017  Document Reviewed: 01/16/2016 Elsevier Patient Education  2020 [employer].   Hypertension, Adult Hypertension is another name for high blood pressure. High blood pressure forces your heart to work harder to pump blood. This can cause problems over time. There are two numbers in a blood pressure reading. There is a top number (systolic) over a bottom number (diastolic). It is best to have a blood pressure that is below 120/80. Healthy choices can help lower your blood pressure, or you may need medicine to help lower it. What are the causes? The cause of this condition is not known. Some conditions may be related to high blood pressure. What increases the risk?  Smoking.  Having type 2 diabetes mellitus, high cholesterol, or both.  Not getting enough exercise or physical activity.  Being overweight.  Having too much fat, sugar, calories, or salt (sodium) in your diet.  Drinking too much alcohol.  Having long-term (chronic) kidney disease.  Having a family history of high blood pressure.  Age. Risk increases with age.  Race. You may be at higher risk if you are African American.  Gender. Men are at higher risk than women before age 64. After age 22, women are at higher risk than men.  Having obstructive sleep apnea.  Stress. What are the signs or symptoms?  High blood pressure may not cause symptoms. Very high blood pressure (hypertensive crisis) may cause: ? Headache. ? Feelings of worry or nervousness (anxiety). ? Shortness of breath. ? Nosebleed. ? A feeling of being sick to your stomach (nausea). ? Throwing up (vomiting). ? Changes in how you see. ? Very bad chest pain. ? Seizures. How is this treated?  This condition is treated by making healthy lifestyle changes, such as: ? Eating healthy foods. ? Exercising more. ? Drinking less alcohol.  Your health care provider may prescribe medicine if lifestyle changes are not enough to get your blood pressure under  control, and if: ? Your top number is above 130. ? Your bottom number is above 80.  Your personal target blood pressure may vary. Follow these instructions at home: Eating and drinking   If told, follow the DASH eating plan. To follow this plan: ? Fill one half of your plate at each meal with fruits and vegetables. ? Fill one fourth of your plate at each meal with whole grains. Whole grains include whole-wheat pasta, brown rice, and whole-grain bread. ? Eat or drink low-fat dairy products, such as skim milk or low-fat yogurt. ? Fill one fourth of your plate at each meal with low-fat (lean) proteins. Low-fat proteins include fish, chicken without skin, eggs, beans, and tofu. ? Avoid fatty meat, cured and processed meat, or chicken with skin. ? Avoid pre-made or processed food.  Eat less than 1,500 mg of salt each day.  Do not drink alcohol if: ? Your doctor tells you not to drink. ? You are pregnant, may be pregnant, or are planning to become pregnant.  If you drink alcohol: ? Limit how much you use to:  0-1  drink a day for women.  0-2 drinks a day for men. ? Be aware of how much alcohol is in your drink. In the [place] one drink equals one 12 oz bottle of beer (355 mL), one 5 oz glass of wine (148 mL), or one 1 oz glass of hard liquor (44 mL). Lifestyle   Work with your doctor to stay at a healthy weight or to lose weight. Ask your doctor what the best weight is for you.  Get at least 30 minutes of exercise most days of the week. This may include walking, swimming, or biking.  Get at least 30 minutes of exercise that strengthens your muscles (resistance exercise) at least 3 days a week. This may include lifting weights or doing Pilates.  Do not use any products that contain nicotine or tobacco, such as cigarettes, e-cigarettes, and chewing tobacco. If you need help quitting, ask your doctor.  Check your blood pressure at home as told by your doctor.  Keep all follow-up  visits as told by your doctor. This is important. Medicines  Take over-the-counter and prescription medicines only as told by your doctor. Follow directions carefully.  Do not skip doses of blood pressure medicine. The medicine does not work as well if you skip doses. Skipping doses also puts you at risk for problems.  Ask your doctor about side effects or reactions to medicines that you should watch for. Contact a doctor if you:  Think you are having a reaction to the medicine you are taking.  Have headaches that keep coming back (recurring).  Feel dizzy.  Have swelling in your ankles.  Have trouble with your vision. Get help right away if you:  Get a very bad headache.  Start to feel mixed up (confused).  Feel weak or numb.  Feel faint.  Have very bad pain in your: ? Chest. ? Belly (abdomen).  Throw up more than once.  Have trouble breathing. Summary  Hypertension is another name for high blood pressure.  High blood pressure forces your heart to work harder to pump blood.  For most people, a normal blood pressure is less than 120/80.  Making healthy choices can help lower blood pressure. If your blood pressure does not get lower with healthy choices, you may need to take medicine. This information is not intended to replace advice given to you by your health care provider. Make sure you discuss any questions you have with your health care provider. Document Revised: 10/02/2017 Document Reviewed: 10/02/2017 Elsevier Patient Education  2020 [employer].

## 2019-07-10 NOTE — Progress Notes (Signed)
Kaitlyn Bennett is a 49 y.o. female  Chief Complaint  Patient presents with  . Hypertension    Pt here for elevated Bp, pt explains that Monday she felt lt side pressure in the back of her head.  Pt's reading that day 135/100.    HPI: Kaitlyn Bennett is a 49 y.o. female here for f/u on her BP. Pt was started on losartan 12.5mg  daily and then increased to 25mg  daily. Pt states her home readings have been elevated.  4 days ago pt felt a pressure on the Lt posterior part of her head and states home BP reading that day was 135/100.  She has been taking amlodipine 5mg  daily x 3 days, ran out of losartan.   History reviewed. No pertinent past medical history.  Past Surgical History:  Procedure Laterality Date  . CESAREAN SECTION     x2  . OOPHORECTOMY     2000    Social History   Socioeconomic History  . Marital status: Married    Spouse name: [name]  . Number of children: 2  . Years of education: Nursing  . Highest education level: Not on file  Occupational History  . Occupation: Optician, dispensing: [employer]  . Occupation: Optician, dispensing: [employer]  Tobacco Use  . Smoking status: Never Smoker  . Smokeless tobacco: Never Used  Substance and Sexual Activity  . Alcohol use: No    Alcohol/week: 0.0 standard drinks  . Drug use: No  . Sexual activity: Yes    Partners: Male    Birth control/protection: None  Other Topics Concern  . Not on file  Social History Narrative   Lives with husband, [name] and two children.    Social Determinants of Health   Financial Resource Strain:   . Difficulty of Paying Living Expenses:   Food Insecurity:   . Worried About Charity fundraiser in the Last Year:   . Arboriculturist in the Last Year:   Transportation Needs:   . Film/video editor (Medical):   Marland Kitchen Lack of Transportation (Non-Medical):   Physical Activity:   . Days of Exercise per Week:   . Minutes of Exercise per  Session:   Stress:   . Feeling of Stress :   Social Connections:   . Frequency of Communication with Friends and Family:   . Frequency of Social Gatherings with Friends and Family:   . Attends Religious Services:   . Active Member of Clubs or Organizations:   . Attends Archivist Meetings:   Marland Kitchen Marital Status:   Intimate Partner Violence:   . Fear of Current or Ex-Partner:   . Emotionally Abused:   Marland Kitchen Physically Abused:   . Sexually Abused:     Family History  Problem Relation Age of Onset  . Hypertension Father      Immunization History  Administered Date(s) Administered  . Influenza-Unspecified 10/07/2015  . Tdap 10/07/2010    Outpatient Encounter Medications as of 07/10/2019  Medication Sig  . Ascorbic Acid (VITAMIN C PO) Take by mouth.  . B Complex Vitamins (B-COMPLEX/B-12) TABS Take by mouth.  . Calcium Carbonate-Vit D-Min (CALTRATE PLUS PO) Take by mouth.  . losartan (COZAAR) 25 MG tablet Take 0.5 tablets (12.5 mg total) by mouth daily.  . TURMERIC PO Take by mouth.   No facility-administered encounter medications on file as of 07/10/2019.     ROS: Pertinent positives  and negatives noted in HPI. Remainder of ROS non-contributory   No Known Allergies  BP 130/90 (BP Location: Left Arm, Patient Position: Sitting, Cuff Size: Normal)   Pulse 72   Temp 97.7 F (36.5 C) (Temporal)   Ht 4\' 11"  (1.499 m)   Wt 142 lb 6.4 oz (64.6 kg)   LMP 06/22/2019   SpO2 98%   BMI 28.76 kg/m    BP Readings from Last 3 Encounters:  07/10/19 130/90  05/29/19 128/90  08/28/18 136/82   Pulse Readings from Last 3 Encounters:  07/10/19 72  05/29/19 79  08/28/18 76   Wt Readings from Last 3 Encounters:  07/10/19 142 lb 6.4 oz (64.6 kg)  05/29/19 140 lb (63.5 kg)  08/28/18 141 lb (64 kg)   Physical Exam  Constitutional: She is oriented to person, place, and time. She appears well-developed and well-nourished. No distress.  Neck: No thyromegaly present.    Cardiovascular: Normal rate, regular rhythm and normal heart sounds.  No murmur heard. Pulmonary/Chest: Effort normal and breath sounds normal. No respiratory distress.  Musculoskeletal:        General: No edema.  Neurological: She is alert and oriented to person, place, and time.  Psychiatric: She has a normal mood and affect. Her behavior is normal.     A/P:  1. Essential hypertension - improved - brought home BP machine to office today and reading is consistent with office manual cuff - cont to walk regularly - pt is doing 08/30/18 most days - cont with low sodium diet - pt wants to stop losartan and switch to norvasc that husband is taking Rx: - amLODipine (NORVASC) 5 MG tablet; Take 1 tablet (5 mg total) by mouth daily.  Dispense: 90 tablet; Refill: 3 - check BP daily at home and send readings thru MyChart in 1.5-2wks Discussed plan and reviewed medications with patient, including risks, benefits, and potential side effects. Pt expressed understand. All questions answered.   This visit occurred during the SARS-CoV-2 public health emergency.  Safety protocols were in place, including screening questions prior to the visit, additional usage of staff PPE, and extensive cleaning of exam room while observing appropriate contact time as indicated for disinfecting solutions.

## 2019-07-24 DIAGNOSIS — M542 Cervicalgia: Secondary | ICD-10-CM | POA: Diagnosis not present

## 2019-08-23 DIAGNOSIS — M542 Cervicalgia: Secondary | ICD-10-CM | POA: Diagnosis not present

## 2019-09-23 ENCOUNTER — Encounter: Payer: Self-pay | Admitting: Family Medicine

## 2019-09-23 ENCOUNTER — Telehealth (INDEPENDENT_AMBULATORY_CARE_PROVIDER_SITE_OTHER): Payer: 59 | Admitting: Family Medicine

## 2019-09-23 VITALS — Ht 59.0 in | Wt 141.0 lb

## 2019-09-23 DIAGNOSIS — M542 Cervicalgia: Secondary | ICD-10-CM | POA: Diagnosis not present

## 2019-09-23 DIAGNOSIS — H00014 Hordeolum externum left upper eyelid: Secondary | ICD-10-CM | POA: Diagnosis not present

## 2019-09-23 NOTE — Progress Notes (Signed)
Established Patient Office Visit  Subjective:  Patient ID: Kaitlyn Bennett, female    DOB: Jan 14, 1971  Age: 49 y.o. MRN: [id number]  CC:  Chief Complaint  Patient presents with  . Acute Visit    C/O left upper eye lid swollen, watery some drainage little pain symptoms started yesterday. Alternating cold and hot compresses     HPI Kaitlyn Bennett presents for evaluation and treatment of a 1 day history of swelling in her left upper lid.  There was no history.  There is been some spontaneous drainage from the lid.  She has been treating it with saline irrigation and compresses.  She is a Engineer, civil (consulting) working with Covid patients.  She had been concerned that maybe she removed her goggles and rupture.  Vision is only disrupted after this area drains.  No prior history of this there was no injury to her eye. No past medical history on file.  Past Surgical History:  Procedure Laterality Date  . CESAREAN SECTION     x2  . OOPHORECTOMY     2000    Family History  Problem Relation Age of Onset  . Hypertension Father     Social History   Socioeconomic History  . Marital status: Married    Spouse name: [name]  . Number of children: 2  . Years of education: Nursing  . Highest education level: Not on file  Occupational History  . Occupation: Academic librarian: [employer]   . Occupation: Academic librarian: [employer]  Tobacco Use  . Smoking status: Never Smoker  . Smokeless tobacco: Never Used  Substance and Sexual Activity  . Alcohol use: No    Alcohol/week: 0.0 standard drinks  . Drug use: No  . Sexual activity: Yes    Partners: Male    Birth control/protection: None  Other Topics Concern  . Not on file  Social History Narrative   Lives with husband, [name] and two children.    Social Determinants of Health   Financial Resource Strain:   . Difficulty of Paying Living Expenses:   Food Insecurity:   . Worried About Patent examiner in the Last Year:   . Barista in the Last Year:   Transportation Needs:   . Freight forwarder (Medical):   Marland Kitchen Lack of Transportation (Non-Medical):   Physical Activity:   . Days of Exercise per Week:   . Minutes of Exercise per Session:   Stress:   . Feeling of Stress :   Social Connections:   . Frequency of Communication with Friends and Family:   . Frequency of Social Gatherings with Friends and Family:   . Attends Religious Services:   . Active Member of Clubs or Organizations:   . Attends Banker Meetings:   Marland Kitchen Marital Status:   Intimate Partner Violence:   . Fear of Current or Ex-Partner:   . Emotionally Abused:   Marland Kitchen Physically Abused:   . Sexually Abused:     Outpatient Medications Prior to Visit  Medication Sig Dispense Refill  . amLODipine (NORVASC) 5 MG tablet Take 1 tablet (5 mg total) by mouth daily. 90 tablet 3  . Ascorbic Acid (VITAMIN C PO) Take by mouth.    . B Complex Vitamins (B-COMPLEX/B-12) TABS Take by mouth.    . Calcium Carbonate-Vit D-Min (CALTRATE PLUS PO) Take by mouth.    . TURMERIC PO Take by mouth.  No facility-administered medications prior to visit.    No Known Allergies  ROS Review of Systems  Constitutional: Negative for diaphoresis, fatigue, fever and unexpected weight change.  HENT: Negative.   Eyes: Positive for discharge. Negative for photophobia, pain, redness, itching and visual disturbance.  Respiratory: Negative.   Cardiovascular: Negative.   Gastrointestinal: Negative.   Allergic/Immunologic: Negative for immunocompromised state.  Neurological: Negative for headaches.  Psychiatric/Behavioral: Negative.       Objective:    Physical Exam Vitals and nursing note reviewed.  Constitutional:      General: She is not in acute distress.    Appearance: Normal appearance. She is not ill-appearing or toxic-appearing.  HENT:     Head: Normocephalic and atraumatic.     Right Ear: External ear  normal.     Left Ear: External ear normal.  Eyes:     General: Gaze aligned appropriately. No scleral icterus.       Right eye: No discharge.        Left eye: Hordeolum present.No discharge.     Extraocular Movements: Extraocular movements intact.     Conjunctiva/sclera: Conjunctivae normal.   Pulmonary:     Effort: Pulmonary effort is normal.  Neurological:     Mental Status: She is alert and oriented to person, place, and time.  Psychiatric:        Mood and Affect: Mood normal.        Behavior: Behavior normal.     Ht 4\' 11"  (1.499 m)   Wt 141 lb (64 kg)   BMI 28.48 kg/m  Wt Readings from Last 3 Encounters:  09/23/19 141 lb (64 kg)  07/10/19 142 lb 6.4 oz (64.6 kg)  05/29/19 140 lb (63.5 kg)     Health Maintenance Due  Topic Date Due  . HIV Screening  Never done  . PAP SMEAR-Modifier  Never done  . INFLUENZA VACCINE  09/06/2019    There are no preventive care reminders to display for this patient.  No results found for: TSH No results found for: WBC, HGB, HCT, MCV, PLT Lab Results  Component Value Date   NA 137 08/28/2018   K 4.0 08/28/2018   CO2 27 08/28/2018   GLUCOSE 96 08/28/2018   BUN 14 08/28/2018   CREATININE 0.69 08/28/2018   AST 14 08/28/2018   ALT 19 08/28/2018   CALCIUM 9.3 08/28/2018   GFR 91.05 08/28/2018   Lab Results  Component Value Date   CHOL 182 08/28/2018   Lab Results  Component Value Date   HDL 56.90 08/28/2018   Lab Results  Component Value Date   LDLCALC 111 (H) 08/28/2018   Lab Results  Component Value Date   TRIG 67.0 08/28/2018   Lab Results  Component Value Date   CHOLHDL 3 08/28/2018   Lab Results  Component Value Date   HGBA1C 5.7 08/28/2018      Assessment & Plan:   Problem List Items Addressed This Visit      Other   Hordeolum of left upper eyelid - Primary      No orders of the defined types were placed in this encounter.   Follow-up: Return in about 1 week (around 09/30/2019), or if symptoms  worsen or fail to improve.   Advised her to apply warm compresses 3 times daily.  Follow-up in 1 week if not improving. 10/02/2019, MD   Virtual Visit via Video Note  I connected with Kaitlyn Bennett on 09/23/19 at 10:00 AM  EDT by a video enabled telemedicine application and verified that I am speaking with the correct person using two identifiers.  Location: Patient: alone in her driveway.  Provider:    I discussed the limitations of evaluation and management by telemedicine and the availability of in person appointments. The patient expressed understanding and agreed to proceed.  History of Present Illness:    Observations/Objective:   Assessment and Plan:   Follow Up Instructions:    I discussed the assessment and treatment plan with the patient. The patient was provided an opportunity to ask questions and all were answered. The patient agreed with the plan and demonstrated an understanding of the instructions.   The patient was advised to call back or seek an in-person evaluation if the symptoms worsen or if the condition fails to improve as anticipated.  I provided 30 minutes of non-face-to-face time during this encounter.   Kaitlyn Sax, MD

## 2019-09-23 NOTE — Patient Instructions (Signed)

## 2019-10-07 MED FILL — AMLODIPINE BESYLATE 5 MG TA: 5 | 90 days supply | Qty: 90 | Fill #1

## 2019-10-24 DIAGNOSIS — M542 Cervicalgia: Secondary | ICD-10-CM | POA: Diagnosis not present

## 2019-11-23 DIAGNOSIS — M542 Cervicalgia: Secondary | ICD-10-CM | POA: Diagnosis not present

## 2019-12-01 ENCOUNTER — Telehealth: Payer: Self-pay | Admitting: Family Medicine

## 2019-12-01 NOTE — Telephone Encounter (Signed)
Patient is calling and stated that she is due for her physical and it has to be done before the end of November but there is no availability until beginning of the year.  Patient wanted to see if she can be worked in [employer] purposes, please advise. CB is [phone]

## 2019-12-02 NOTE — Telephone Encounter (Signed)
LVM for patient to call back to schedule a cpe.

## 2019-12-02 NOTE — Telephone Encounter (Signed)
Hey, I asked about this and Dr. Salena Saner is ok with it. The days suggested would have to be Nov 3rd or Nov 17th.  Those are the only 2 day that patient can be worked in.  Please advise patient.  Thank you.

## 2019-12-23 ENCOUNTER — Encounter: Payer: Self-pay | Admitting: Family Medicine

## 2019-12-23 ENCOUNTER — Ambulatory Visit (INDEPENDENT_AMBULATORY_CARE_PROVIDER_SITE_OTHER): Payer: 59 | Admitting: Family Medicine

## 2019-12-23 ENCOUNTER — Other Ambulatory Visit: Payer: Self-pay

## 2019-12-23 VITALS — BP 132/86 | HR 61 | Temp 97.3°F | Ht 59.0 in | Wt 139.0 lb

## 2019-12-23 DIAGNOSIS — R7301 Impaired fasting glucose: Secondary | ICD-10-CM | POA: Diagnosis not present

## 2019-12-23 DIAGNOSIS — Z1211 Encounter for screening for malignant neoplasm of colon: Secondary | ICD-10-CM | POA: Diagnosis not present

## 2019-12-23 DIAGNOSIS — Z0000 Encounter for general adult medical examination without abnormal findings: Secondary | ICD-10-CM | POA: Diagnosis not present

## 2019-12-23 NOTE — Progress Notes (Signed)
Kaitlyn Bennett is a 49 y.o. female  Chief Complaint  Patient presents with  . Annual Exam    CPE, concerns about elevated BP readings. Palpitations that come and go some pressure in chest sometimes. Patient fasting for labs.     HPI: Kaitlyn Bennett is a 49 y.o. female seen today for annual CPE, labs.  Last PAP: pt will do this with Dr. Algie Coffer  Last mammo: 02/2019 Last colonoscopy: never  Diet/Exercise: "struggling" with diet; exercises (yoga) 2-3 days per week  Dental: due Vision: UTD - last exam 2 wks ago  Med refills needed today? no  History reviewed. No pertinent past medical history.  Past Surgical History:  Procedure Laterality Date  . CESAREAN SECTION     x2  . OOPHORECTOMY     2000    Social History   Socioeconomic History  . Marital status: Married    Spouse name: [name]  . Number of children: 2  . Years of education: Nursing  . Highest education level: Not on file  Occupational History  . Occupation: Academic librarian: [employer]  . Occupation: Academic librarian: [employer]  Tobacco Use  . Smoking status: Never Smoker  . Smokeless tobacco: Never Used  Vaping Use  . Vaping Use: Never used  Substance and Sexual Activity  . Alcohol use: No    Alcohol/week: 0.0 standard drinks  . Drug use: No  . Sexual activity: Yes    Partners: Male    Birth control/protection: None  Other Topics Concern  . Not on file  Social History Narrative   Lives with husband, [name] and two children.    Social Determinants of Health   Financial Resource Strain:   . Difficulty of Paying Living Expenses: Not on file  Food Insecurity:   . Worried About Programme researcher, broadcasting/film/video in the Last Year: Not on file  . Ran Out of Food in the Last Year: Not on file  Transportation Needs:   . Lack of Transportation (Medical): Not on file  . Lack of Transportation (Non-Medical): Not on file  Physical Activity:   . Days of Exercise  per Week: Not on file  . Minutes of Exercise per Session: Not on file  Stress:   . Feeling of Stress : Not on file  Social Connections:   . Frequency of Communication with Friends and Family: Not on file  . Frequency of Social Gatherings with Friends and Family: Not on file  . Attends Religious Services: Not on file  . Active Member of Clubs or Organizations: Not on file  . Attends Banker Meetings: Not on file  . Marital Status: Not on file  Intimate Partner Violence:   . Fear of Current or Ex-Partner: Not on file  . Emotionally Abused: Not on file  . Physically Abused: Not on file  . Sexually Abused: Not on file    Family History  Problem Relation Age of Onset  . Hypertension Father      Immunization History  Administered Date(s) Administered  . Influenza-Unspecified 10/07/2015, 12/01/2019  . PFIZER SARS-COV-2 Vaccination 02/01/2019, 03/02/2019  . Tdap 10/07/2010    Outpatient Encounter Medications as of 12/23/2019  Medication Sig  . amLODipine (NORVASC) 5 MG tablet Take 1 tablet (5 mg total) by mouth daily.  . Ascorbic Acid (VITAMIN C PO) Take by mouth.  . B Complex Vitamins (B-COMPLEX/B-12) TABS Take by mouth.  . Calcium Carbonate-Vit  D-Min (CALTRATE PLUS PO) Take by mouth.  . TURMERIC PO Take by mouth.   No facility-administered encounter medications on file as of 12/23/2019.     ROS: Gen: no fever, chills  Skin: no rash, itching ENT: no ear pain, ear drainage, nasal congestion, rhinorrhea, sinus pressure, sore throat Eyes: no blurry vision, double vision Resp: no cough, wheeze,SOB Breast: no breast tenderness, no nipple discharge, no breast masses CV: as above in HPI GI: + heartburn - usually afte 2 cups of coffee, no n/v/d/c, abd pain GU: no dysuria, urgency, frequency, hematuria MSK: no joint pain, myalgias, back pain Neuro: no dizziness, headache, weakness, vertigo Psych: no depression, anxiety, insomnia   No Known Allergies  BP 132/86    Pulse 61   Temp (!) 97.3 F (36.3 C) (Tympanic)   Ht 4\' 11"  (1.499 m)   Wt 139 lb (63 kg)   SpO2 98%   BMI 28.07 kg/m   BP Readings from Last 3 Encounters:  12/23/19 132/86  07/10/19 130/90  05/29/19 128/90   Pulse Readings from Last 3 Encounters:  12/23/19 61  07/10/19 72  05/29/19 79   Physical Exam Exam conducted with a chaperone present.  Constitutional:      General: She is not in acute distress.    Appearance: She is well-developed.  HENT:     Head: Normocephalic and atraumatic.     Right Ear: Tympanic membrane and ear canal normal.     Left Ear: Tympanic membrane and ear canal normal.     Nose: Nose normal.  Eyes:     Conjunctiva/sclera: Conjunctivae normal.     Pupils: Pupils are equal, round, and reactive to light.  Neck:     Thyroid: No thyromegaly.  Cardiovascular:     Rate and Rhythm: Normal rate and regular rhythm.     Heart sounds: Normal heart sounds. No murmur heard.   Pulmonary:     Effort: Pulmonary effort is normal. No respiratory distress.     Breath sounds: Normal breath sounds. No wheezing or rhonchi.  Abdominal:     General: Bowel sounds are normal. There is no distension.     Palpations: Abdomen is soft. There is no mass.     Tenderness: There is no abdominal tenderness.  Genitourinary:    Labia:        Right: No rash, tenderness or lesion.        Left: No rash, tenderness or lesion.      Vagina: No vaginal discharge, erythema, tenderness, bleeding or lesions.     Cervix: No cervical motion tenderness, discharge, friability, erythema or cervical bleeding.     Uterus: Normal. Not enlarged.      Adnexa: Right adnexa normal and left adnexa normal.       Right: No mass, tenderness or fullness.         Left: No mass, tenderness or fullness.    Musculoskeletal:     Cervical back: Neck supple.  Lymphadenopathy:     Cervical: No cervical adenopathy.  Skin:    General: Skin is warm and dry.  Neurological:     Mental Status: She is alert  and oriented to person, place, and time.     Motor: No abnormal muscle tone.     Coordination: Coordination normal.  Psychiatric:        Behavior: Behavior normal.     A/P:  1. Annual physical exam - discussed importance of regular CV exercise, healthy diet, adequate sleep - UTD on  vision, due for dental - immunizations UTD, covid booster in 01/2020 - PAP with GYN, referral for colonoscopy placed today, mammo UTD - ALT; Future - AST; Future - Basic metabolic panel; Future - CBC; Future - Lipid panel; Future - next CPE in 1 year  2. IFG (impaired fasting glucose) - Hemoglobin A1c; Future  3. Screening for colon cancer - Ambulatory referral to Gastroenterology   This visit occurred during the SARS-CoV-2 public health emergency.  Safety protocols were in place, including screening questions prior to the visit, additional usage of staff PPE, and extensive cleaning of exam room while observing appropriate contact time as indicated for disinfecting solutions.

## 2019-12-24 DIAGNOSIS — M542 Cervicalgia: Secondary | ICD-10-CM | POA: Diagnosis not present

## 2019-12-29 ENCOUNTER — Other Ambulatory Visit: Payer: Self-pay

## 2019-12-29 ENCOUNTER — Other Ambulatory Visit (INDEPENDENT_AMBULATORY_CARE_PROVIDER_SITE_OTHER): Payer: 59

## 2019-12-29 DIAGNOSIS — R7301 Impaired fasting glucose: Secondary | ICD-10-CM | POA: Diagnosis not present

## 2019-12-29 DIAGNOSIS — Z0000 Encounter for general adult medical examination without abnormal findings: Secondary | ICD-10-CM

## 2019-12-29 LAB — BASIC METABOLIC PANEL
BUN: 13 mg/dL (ref 6–23)
CO2: 31 mEq/L (ref 19–32)
Calcium: 9 mg/dL (ref 8.4–10.5)
Chloride: 104 mEq/L (ref 96–112)
Creatinine, Ser: 0.7 mg/dL (ref 0.40–1.20)
GFR: 102.06 mL/min (ref >60.00)
Glucose, Bld: 102 mg/dL — ABNORMAL HIGH (ref 70–99)
Potassium: 3.7 mEq/L (ref 3.5–5.1)
Sodium: 140 mEq/L (ref 135–145)

## 2019-12-29 LAB — LIPID PANEL
Cholesterol: 181 mg/dL (ref 0–200)
HDL: 57.2 mg/dL (ref >39.00)
LDL Cholesterol: 107 mg/dL — ABNORMAL HIGH (ref 0–99)
NonHDL: 124.1
Total CHOL/HDL Ratio: 3
Triglycerides: 86 mg/dL (ref 0.0–149.0)
VLDL: 17.2 mg/dL (ref 0.0–40.0)

## 2019-12-29 LAB — CBC
HCT: 44.2 % (ref 36.0–46.0)
Hemoglobin: 14.9 g/dL (ref 12.0–15.0)
MCHC: 33.8 g/dL (ref 30.0–36.0)
MCV: 87.5 fl (ref 78.0–100.0)
Platelets: 224 10*3/uL (ref 150.0–400.0)
RBC: 5.06 Mil/uL (ref 3.87–5.11)
RDW: 12.3 % (ref 11.5–15.5)
WBC: 6 10*3/uL (ref 4.0–10.5)

## 2019-12-29 LAB — ALT: ALT: 16 U/L (ref 0–35)

## 2019-12-29 LAB — HEMOGLOBIN A1C: Hgb A1c MFr Bld: 5.9 % (ref 4.6–6.5)

## 2019-12-29 LAB — AST: AST: 13 U/L (ref 0–37)

## 2020-01-06 ENCOUNTER — Encounter: Payer: Self-pay | Admitting: Family Medicine

## 2020-02-16 ENCOUNTER — Ambulatory Visit: Payer: 59

## 2020-02-26 ENCOUNTER — Ambulatory Visit: Payer: 59

## 2020-02-29 MED FILL — AMLODIPINE BESYLATE 5 MG TA: 5 | 90 days supply | Qty: 90 | Fill #2

## 2020-03-01 DIAGNOSIS — Z1231 Encounter for screening mammogram for malignant neoplasm of breast: Secondary | ICD-10-CM | POA: Diagnosis not present

## 2020-06-06 ENCOUNTER — Other Ambulatory Visit (HOSPITAL_BASED_OUTPATIENT_CLINIC_OR_DEPARTMENT_OTHER): Payer: Self-pay

## 2020-06-06 MED FILL — Amlodipine Besylate Tab 5 MG (Base Equivalent): ORAL | 90 days supply | Qty: 90 | Fill #0 | Status: AC

## 2020-08-24 ENCOUNTER — Other Ambulatory Visit: Payer: Self-pay

## 2020-08-24 ENCOUNTER — Encounter: Payer: Self-pay | Admitting: Family Medicine

## 2020-08-24 ENCOUNTER — Other Ambulatory Visit (HOSPITAL_BASED_OUTPATIENT_CLINIC_OR_DEPARTMENT_OTHER): Payer: Self-pay

## 2020-08-24 ENCOUNTER — Ambulatory Visit: Payer: 59 | Admitting: Family Medicine

## 2020-08-24 VITALS — BP 122/80 | HR 63 | Temp 97.8°F | Wt 136.4 lb

## 2020-08-24 DIAGNOSIS — R7301 Impaired fasting glucose: Secondary | ICD-10-CM | POA: Diagnosis not present

## 2020-08-24 DIAGNOSIS — Z13220 Encounter for screening for lipoid disorders: Secondary | ICD-10-CM | POA: Diagnosis not present

## 2020-08-24 DIAGNOSIS — I10 Essential (primary) hypertension: Secondary | ICD-10-CM

## 2020-08-24 LAB — HEMOGLOBIN A1C: Hgb A1c MFr Bld: 5.9 % (ref 4.6–6.5)

## 2020-08-24 LAB — LIPID PANEL
Cholesterol: 166 mg/dL (ref 0–200)
HDL: 56.3 mg/dL (ref >39.00)
LDL Cholesterol: 97 mg/dL (ref 0–99)
NonHDL: 110.11
Total CHOL/HDL Ratio: 3
Triglycerides: 67 mg/dL (ref 0.0–149.0)
VLDL: 13.4 mg/dL (ref 0.0–40.0)

## 2020-08-24 MED ORDER — AMLODIPINE BESYLATE 5 MG PO TABS
ORAL_TABLET | Freq: Every day | ORAL | 3 refills | Status: DC
  Filled 2020-08-24: qty 90, 90d supply, fill #0
  Filled 2020-12-27: qty 90, 90d supply, fill #1
  Filled 2021-03-28: qty 90, 90d supply, fill #2
  Filled 2021-06-27: qty 90, 90d supply, fill #3

## 2020-08-24 NOTE — Progress Notes (Signed)
Chief Complaint  Patient presents with   Follow-up    A1c recheck and pt request lipid panel.    HPI: Kaitlyn Bennett is a 50 y.o. female here for IFG, HTN follow-up. She is fasting for labs and would like FLP done today as well as A1C.  For HTN, pt is taking norvasc 5 mg daily.  She is not on med for IFG.  Diet: cut down on carbs Exercise: walking  Lab Results  Component Value Date   HGBA1C 5.9 12/29/2019  Previous A1C = 5.7  BP Readings from Last 3 Encounters:  08/24/20 122/80  12/23/19 132/86  07/10/19 130/90   Lab Results  Component Value Date   CREATININE 0.70 12/29/2019   BUN 13 12/29/2019   NA 140 12/29/2019   K 3.7 12/29/2019   CL 104 12/29/2019   CO2 31 12/29/2019   Lab Results  Component Value Date   CHOL 181 12/29/2019   HDL 57.20 12/29/2019   LDLCALC 107 (H) 12/29/2019   TRIG 86.0 12/29/2019   CHOLHDL 3 12/29/2019    History reviewed. No pertinent past medical history.  Past Surgical History:  Procedure Laterality Date   CESAREAN SECTION     x2   OOPHORECTOMY     2000    Social History   Socioeconomic History   Marital status: Married    Spouse name: [name]   Number of children: 2   Years of education: Nursing   Highest education level: Not on file  Occupational History   Occupation: Academic librarian: [employer]   Occupation: Nurse    Employer: [employer]  Tobacco Use   Smoking status: Never   Smokeless tobacco: Never  Vaping Use   Vaping Use: Never used  Substance and Sexual Activity   Alcohol use: No    Alcohol/week: 0.0 standard drinks   Drug use: No   Sexual activity: Yes    Partners: Male    Birth control/protection: None  Other Topics Concern   Not on file  Social History Narrative   Lives with husband, [name] and two children.    Social Determinants of Health   Financial Resource Strain: Not on file  Food Insecurity: Not on file  Transportation Needs: Not on  file  Physical Activity: Not on file  Stress: Not on file  Social Connections: Not on file  Intimate Partner Violence: Not on file    Family History  Problem Relation Age of Onset   Hypertension Father      Immunization History  Administered Date(s) Administered   Influenza-Unspecified 10/07/2015, 12/01/2019   PFIZER(Purple Top)SARS-COV-2 Vaccination 02/01/2019, 03/02/2019   Tdap 10/07/2010    Outpatient Encounter Medications as of 08/24/2020  Medication Sig   amLODipine (NORVASC) 5 MG tablet TAKE 1 TABLET (5 MG TOTAL) BY MOUTH DAILY.   Ascorbic Acid (VITAMIN C PO) Take by mouth.   B Complex Vitamins (B-COMPLEX/B-12) TABS Take by mouth.   Calcium Carbonate-Vit D-Min (CALTRATE PLUS PO) Take by mouth.   TURMERIC PO Take by mouth.   No facility-administered encounter medications on file as of 08/24/2020.     ROS: Pertinent positives and negatives noted in HPI. Remainder of ROS non-contributory  No Known Allergies  BP 122/80   Pulse 63   Temp 97.8 F (36.6 C) (Temporal)   Wt 136 lb 6.4 oz (61.9 kg)   SpO2 97%   BMI 27.55 kg/m  Wt Readings from Last 3 Encounters:  08/24/20 136 lb 6.4 oz (61.9 kg)  12/23/19 139 lb (63 kg)  09/23/19 141 lb (64 kg)   Temp Readings from Last 3 Encounters:  08/24/20 97.8 F (36.6 C) (Temporal)  12/23/19 (!) 97.3 F (36.3 C) (Tympanic)  07/10/19 97.7 F (36.5 C) (Temporal)   BP Readings from Last 3 Encounters:  08/24/20 122/80  12/23/19 132/86  07/10/19 130/90   Pulse Readings from Last 3 Encounters:  08/24/20 63  12/23/19 61  07/10/19 72     Physical Exam Constitutional:      General: She is not in acute distress.    Appearance: Normal appearance. She is not ill-appearing.  Cardiovascular:     Rate and Rhythm: Normal rate and regular rhythm.     Pulses: Normal pulses.  Pulmonary:     Effort: No respiratory distress.     Breath sounds: Normal breath sounds. No wheezing or rhonchi.  Neurological:     Mental Status:  She is alert and oriented to person, place, and time.  Psychiatric:        Mood and Affect: Mood normal.        Behavior: Behavior normal.     A/P:  1. IFG (impaired fasting glucose) - has been trying to cut down on carbs - Hemoglobin A1c  2. Essential hypertension - controlled, at goal - cont norvasc 5mg  daily  3. Screening for lipid disorders - Lipid panel    This visit occurred during the SARS-CoV-2 public health emergency.  Safety protocols were in place, including screening questions prior to the visit, additional usage of staff PPE, and extensive cleaning of exam room while observing appropriate contact time as indicated for disinfecting solutions.

## 2020-12-27 ENCOUNTER — Other Ambulatory Visit (HOSPITAL_BASED_OUTPATIENT_CLINIC_OR_DEPARTMENT_OTHER): Payer: Self-pay

## 2020-12-28 ENCOUNTER — Other Ambulatory Visit (HOSPITAL_BASED_OUTPATIENT_CLINIC_OR_DEPARTMENT_OTHER): Payer: Self-pay

## 2020-12-28 MED ORDER — VUITY 1.25 % OP SOLN
1.0000 [drp] | Freq: Three times a day (TID) | OPHTHALMIC | 3 refills | Status: DC | PRN
  Filled 2020-12-28: qty 2.5, 8d supply, fill #0

## 2020-12-30 ENCOUNTER — Other Ambulatory Visit (HOSPITAL_BASED_OUTPATIENT_CLINIC_OR_DEPARTMENT_OTHER): Payer: Self-pay

## 2021-01-06 ENCOUNTER — Other Ambulatory Visit (HOSPITAL_BASED_OUTPATIENT_CLINIC_OR_DEPARTMENT_OTHER): Payer: Self-pay

## 2021-02-21 ENCOUNTER — Other Ambulatory Visit (HOSPITAL_BASED_OUTPATIENT_CLINIC_OR_DEPARTMENT_OTHER): Payer: Self-pay

## 2021-02-21 DIAGNOSIS — J209 Acute bronchitis, unspecified: Secondary | ICD-10-CM | POA: Diagnosis not present

## 2021-02-21 MED ORDER — DOXYCYCLINE MONOHYDRATE 100 MG PO TABS
ORAL_TABLET | ORAL | 0 refills | Status: DC
  Filled 2021-02-21: qty 10, 5d supply, fill #0

## 2021-02-21 MED ORDER — ALBUTEROL SULFATE HFA 108 (90 BASE) MCG/ACT IN AERS
INHALATION_SPRAY | RESPIRATORY_TRACT | 0 refills | Status: DC
  Filled 2021-02-21: qty 18, 17d supply, fill #0

## 2021-02-21 MED ORDER — PROMETHAZINE-DM 6.25-15 MG/5ML PO SYRP
ORAL_SOLUTION | ORAL | 0 refills | Status: DC
  Filled 2021-02-21: qty 180, 6d supply, fill #0

## 2021-02-21 MED ORDER — PREDNISONE 50 MG PO TABS
ORAL_TABLET | ORAL | 0 refills | Status: DC
  Filled 2021-02-21: qty 5, 5d supply, fill #0

## 2021-02-23 ENCOUNTER — Other Ambulatory Visit (HOSPITAL_BASED_OUTPATIENT_CLINIC_OR_DEPARTMENT_OTHER): Payer: Self-pay

## 2021-02-23 DIAGNOSIS — J0120 Acute ethmoidal sinusitis, unspecified: Secondary | ICD-10-CM | POA: Diagnosis not present

## 2021-02-23 MED ORDER — AMOXICILLIN-POT CLAVULANATE 875-125 MG PO TABS
ORAL_TABLET | ORAL | 0 refills | Status: DC
  Filled 2021-02-23: qty 20, 10d supply, fill #0

## 2021-03-28 ENCOUNTER — Other Ambulatory Visit (HOSPITAL_BASED_OUTPATIENT_CLINIC_OR_DEPARTMENT_OTHER): Payer: Self-pay

## 2021-04-25 NOTE — Progress Notes (Deleted)
? ?Established Patient Office Visit ? ?Subjective:  ?Patient ID: Kaitlyn Bennett, female    DOB: 08-Aug-1970  Age: 51 y.o. MRN: [id number] ? ?CC: No chief complaint on file. ? ? ?HPI ?Kaitlyn Bennett presents to transfer care to a new provider.  Introduced to Designer, jewellery role and practice setting.  All questions answered.  Discussed provider/patient relationship and expectations. ? ? ?No past medical history on file. ? ?Past Surgical History:  ?Procedure Laterality Date  ? CESAREAN SECTION    ? x2  ? OOPHORECTOMY    ? 2000  ? ? ?Family History  ?Problem Relation Age of Onset  ? Hypertension Father   ? ? ?Social History  ? ?Socioeconomic History  ? Marital status: Married  ?  Spouse name: [name]  ? Number of children: 2  ? Years of education: Nursing  ? Highest education level: Not on file  ?Occupational History  ? Occupation: Nurse  ?  Employer: [employer]  ? Occupation: Nurse  ?  Employer: [employer]  ?Tobacco Use  ? Smoking status: Never  ? Smokeless tobacco: Never  ?Vaping Use  ? Vaping Use: Never used  ?Substance and Sexual Activity  ? Alcohol use: No  ?  Alcohol/week: 0.0 standard drinks  ? Drug use: No  ? Sexual activity: Yes  ?  Partners: Male  ?  Birth control/protection: None  ?Other Topics Concern  ? Not on file  ?Social History Narrative  ? Lives with husband, [name] and two children.   ? ?Social Determinants of Health  ? ?Financial Resource Strain: Not on file  ?Food Insecurity: Not on file  ?Transportation Needs: Not on file  ?Physical Activity: Not on file  ?Stress: Not on file  ?Social Connections: Not on file  ?Intimate Partner Violence: Not on file  ? ? ?Outpatient Medications Prior to Visit  ?Medication Sig Dispense Refill  ? albuterol (VENTOLIN HFA) 108 (90 Base) MCG/ACT inhaler Inhale 2 puffs by mouth into the lungs every 4 to 6 hours as needed for shortness of breath and wheezing for 7 days 18 g 0  ? amLODipine (NORVASC) 5 MG tablet TAKE 1  TABLET (5 MG TOTAL) BY MOUTH DAILY. 90 tablet 3  ? amoxicillin-clavulanate (AUGMENTIN) 875-125 MG tablet Take 1 tablet by mouth 2 (two) times a day for 10 days 20 tablet 0  ? Ascorbic Acid (VITAMIN C PO) Take by mouth.    ? B Complex Vitamins (B-COMPLEX/B-12) TABS Take by mouth.    ? Calcium Carbonate-Vit D-Min (CALTRATE PLUS PO) Take by mouth.    ? doxycycline (ADOXA) 100 MG tablet Take 1 tablet by mouth twice a day 10 tablet 0  ? predniSONE (DELTASONE) 50 MG tablet Take 1 tablet by mouth daily for 5 days 5 tablet 0  ? promethazine-dextromethorphan (PROMETHAZINE-DM) 6.25-15 MG/5ML syrup Take 5 ml by  mouth every 4 to 6 hours as needed for cough 180 mL 0  ? TURMERIC PO Take by mouth.    ? VUITY 1.25 % SOLN Place 1 drop into both eyes every 8 (eight) hours as needed. 2.5 mL 3  ? ?No facility-administered medications prior to visit.  ? ? ?No Known Allergies ? ?ROS ?Review of Systems ? ?  ?Objective:  ?  ?Physical Exam ? ?There were no vitals taken for this visit. ?Wt Readings from Last 3 Encounters:  ?08/24/20 136 lb 6.4 oz (61.9 kg)  ?12/23/19 139 lb (63 kg)  ?09/23/19 141 lb (64 kg)  ? ? ? ?  Health Maintenance Due  ?Topic Date Due  ? HIV Screening  Never done  ? PAP SMEAR-Modifier  Never done  ? COLONOSCOPY (Pts 45-85yrs Insurance coverage will need to be confirmed)  Never done  ? COVID-19 Vaccine (3 - Booster for Pfizer series) 04/27/2019  ? INFLUENZA VACCINE  09/05/2020  ? TETANUS/TDAP  10/06/2020  ? MAMMOGRAM  04/14/2021  ? Zoster Vaccines- Shingrix (1 of 2) Never done  ? ? ?There are no preventive care reminders to display for this patient. ? ?No results found for: TSH ?Lab Results  ?Component Value Date  ? WBC 6.0 12/29/2019  ? HGB 14.9 12/29/2019  ? HCT 44.2 12/29/2019  ? MCV 87.5 12/29/2019  ? PLT 224.0 12/29/2019  ? ?Lab Results  ?Component Value Date  ? NA 140 12/29/2019  ? K 3.7 12/29/2019  ? CO2 31 12/29/2019  ? GLUCOSE 102 (H) 12/29/2019  ? BUN 13 12/29/2019  ? CREATININE 0.70 12/29/2019  ? AST 13  12/29/2019  ? ALT 16 12/29/2019  ? CALCIUM 9.0 12/29/2019  ? GFR 102.06 12/29/2019  ? ?Lab Results  ?Component Value Date  ? CHOL 166 08/24/2020  ? ?Lab Results  ?Component Value Date  ? HDL 56.30 08/24/2020  ? ?Lab Results  ?Component Value Date  ? [place] 97 08/24/2020  ? ?Lab Results  ?Component Value Date  ? TRIG 67.0 08/24/2020  ? ?Lab Results  ?Component Value Date  ? CHOLHDL 3 08/24/2020  ? ?Lab Results  ?Component Value Date  ? HGBA1C 5.9 08/24/2020  ? ? ?  ?Assessment & Plan:  ? ?Problem List Items Addressed This Visit   ?None ? ? ?No orders of the defined types were placed in this encounter. ? ? ?Follow-up: No follow-ups on file.  ? ? ?Charyl Dancer, NP ?

## 2021-04-26 ENCOUNTER — Ambulatory Visit: Payer: 59 | Admitting: Nurse Practitioner

## 2021-06-27 ENCOUNTER — Other Ambulatory Visit (HOSPITAL_BASED_OUTPATIENT_CLINIC_OR_DEPARTMENT_OTHER): Payer: Self-pay

## 2021-07-07 DIAGNOSIS — Z1231 Encounter for screening mammogram for malignant neoplasm of breast: Secondary | ICD-10-CM | POA: Diagnosis not present

## 2021-07-14 DIAGNOSIS — N6002 Solitary cyst of left breast: Secondary | ICD-10-CM | POA: Diagnosis not present

## 2021-07-14 DIAGNOSIS — R928 Other abnormal and inconclusive findings on diagnostic imaging of breast: Secondary | ICD-10-CM | POA: Diagnosis not present

## 2021-07-14 LAB — HM MAMMOGRAPHY

## 2021-09-11 NOTE — Progress Notes (Unsigned)
   New Patient Visit  There were no vitals taken for this visit.   Subjective:    Patient ID: [name], female    DOB: 20-Feb-1970, 51 y.o.   MRN: [id number]  CC: No chief complaint on file.   HPI: THOMASENA VANDENHEUVEL is a 51 y.o. female presents to transfer care to a new provider.  Introduced to Publishing rights manager role and practice setting.  All questions answered.  Discussed provider/patient relationship and expectations.   No past medical history on file.  Past Surgical History:  Procedure Laterality Date   CESAREAN SECTION     x2   OOPHORECTOMY     2000    Family History  Problem Relation Age of Onset   Hypertension Father      Social History   Tobacco Use   Smoking status: Never   Smokeless tobacco: Never  Vaping Use   Vaping Use: Never used  Substance Use Topics   Alcohol use: No    Alcohol/week: 0.0 standard drinks of alcohol   Drug use: No    Current Outpatient Medications on File Prior to Visit  Medication Sig Dispense Refill   albuterol (VENTOLIN HFA) 108 (90 Base) MCG/ACT inhaler Inhale 2 puffs by mouth into the lungs every 4 to 6 hours as needed for shortness of breath and wheezing for 7 days 18 g 0   amLODipine (NORVASC) 5 MG tablet TAKE 1 TABLET (5 MG TOTAL) BY MOUTH DAILY. 90 tablet 3   amoxicillin-clavulanate (AUGMENTIN) 875-125 MG tablet Take 1 tablet by mouth 2 (two) times a day for 10 days 20 tablet 0   Ascorbic Acid (VITAMIN C PO) Take by mouth.     B Complex Vitamins (B-COMPLEX/B-12) TABS Take by mouth.     Calcium Carbonate-Vit D-Min (CALTRATE PLUS PO) Take by mouth.     doxycycline (ADOXA) 100 MG tablet Take 1 tablet by mouth twice a day 10 tablet 0   predniSONE (DELTASONE) 50 MG tablet Take 1 tablet by mouth daily for 5 days 5 tablet 0   promethazine-dextromethorphan (PROMETHAZINE-DM) 6.25-15 MG/5ML syrup Take 5 ml by  mouth every 4 to 6 hours as needed for cough 180 mL 0   TURMERIC PO Take by mouth.     VUITY 1.25 % SOLN Place 1  drop into both eyes every 8 (eight) hours as needed. 2.5 mL 3   No current facility-administered medications on file prior to visit.     Review of Systems      Objective:    There were no vitals taken for this visit.  Wt Readings from Last 3 Encounters:  08/24/20 136 lb 6.4 oz (61.9 kg)  12/23/19 139 lb (63 kg)  09/23/19 141 lb (64 kg)    BP Readings from Last 3 Encounters:  08/24/20 122/80  12/23/19 132/86  07/10/19 130/90    Physical Exam     Assessment & Plan:   Problem List Items Addressed This Visit   None    Follow up plan: No follow-ups on file.

## 2021-09-12 ENCOUNTER — Encounter: Payer: Self-pay | Admitting: Nurse Practitioner

## 2021-09-12 ENCOUNTER — Ambulatory Visit: Payer: 59 | Admitting: Nurse Practitioner

## 2021-09-12 VITALS — BP 128/84 | HR 74 | Temp 97.6°F | Wt 147.2 lb

## 2021-09-12 DIAGNOSIS — R0683 Snoring: Secondary | ICD-10-CM | POA: Diagnosis not present

## 2021-09-12 DIAGNOSIS — I10 Essential (primary) hypertension: Secondary | ICD-10-CM

## 2021-09-12 DIAGNOSIS — Z0000 Encounter for general adult medical examination without abnormal findings: Secondary | ICD-10-CM

## 2021-09-12 DIAGNOSIS — Z1211 Encounter for screening for malignant neoplasm of colon: Secondary | ICD-10-CM | POA: Diagnosis not present

## 2021-09-12 DIAGNOSIS — Z13220 Encounter for screening for lipoid disorders: Secondary | ICD-10-CM

## 2021-09-12 DIAGNOSIS — R7303 Prediabetes: Secondary | ICD-10-CM

## 2021-09-12 NOTE — Assessment & Plan Note (Signed)
Last A1c was 5.9%. Will recheck A1c today 

## 2021-09-12 NOTE — Patient Instructions (Signed)
It was great to see you!  You are due for Tetanus and shingrix vaccines. Please call to schedule a nurse visit for these.   I have ordered the cologuard to be sent to your house.   I have ordered a sleep study to evaluate your snoring and elevated diastolic blood pressure.   Please call your GYN to schedule a visit.   Let's follow-up in 6 months, sooner if you have concerns.  If a referral was placed today, you will be contacted for an appointment. Please note that routine referrals can sometimes take up to 3-4 weeks to process. Please call our office if you haven't heard anything after this time frame.  Take care,  Rodman Pickle, NP

## 2021-09-12 NOTE — Assessment & Plan Note (Signed)
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 With her diastolic blood pressure being elevated and snoring, will place referral for sleep study.

## 2021-09-12 NOTE — Assessment & Plan Note (Signed)
Chronic, stable. Today her BP is 128/84. She has been noticing in certain situations her BP will elevate to 150/100s. Encouraged her to keep checking her BP daily and writing it down. Continue amlodipine 5mg  daily. Follow-up in 6 months.

## 2021-09-15 ENCOUNTER — Other Ambulatory Visit (INDEPENDENT_AMBULATORY_CARE_PROVIDER_SITE_OTHER): Payer: 59

## 2021-09-15 DIAGNOSIS — Z0000 Encounter for general adult medical examination without abnormal findings: Secondary | ICD-10-CM | POA: Diagnosis not present

## 2021-09-15 DIAGNOSIS — R7303 Prediabetes: Secondary | ICD-10-CM | POA: Diagnosis not present

## 2021-09-15 DIAGNOSIS — Z13220 Encounter for screening for lipoid disorders: Secondary | ICD-10-CM

## 2021-09-15 LAB — COMPREHENSIVE METABOLIC PANEL
ALT: 24 U/L (ref 0–35)
AST: 21 U/L (ref 0–37)
Albumin: 4.3 g/dL (ref 3.5–5.2)
Alkaline Phosphatase: 59 U/L (ref 39–117)
BUN: 14 mg/dL (ref 6–23)
CO2: 30 mEq/L (ref 19–32)
Calcium: 9.2 mg/dL (ref 8.4–10.5)
Chloride: 100 mEq/L (ref 96–112)
Creatinine, Ser: 0.66 mg/dL (ref 0.40–1.20)
GFR: 102.28 mL/min (ref >60.00)
Glucose, Bld: 105 mg/dL — ABNORMAL HIGH (ref 70–99)
Potassium: 3.7 mEq/L (ref 3.5–5.1)
Sodium: 137 mEq/L (ref 135–145)
Total Bilirubin: 0.5 mg/dL (ref 0.2–1.2)
Total Protein: 6.8 g/dL (ref 6.0–8.3)

## 2021-09-15 LAB — CBC
HCT: 43.5 % (ref 36.0–46.0)
Hemoglobin: 14.5 g/dL (ref 12.0–15.0)
MCHC: 33.3 g/dL (ref 30.0–36.0)
MCV: 88.6 fl (ref 78.0–100.0)
Platelets: 249 10*3/uL (ref 150.0–400.0)
RBC: 4.9 Mil/uL (ref 3.87–5.11)
RDW: 13.4 % (ref 11.5–15.5)
WBC: 7.7 10*3/uL (ref 4.0–10.5)

## 2021-09-15 LAB — LIPID PANEL
Cholesterol: 183 mg/dL (ref 0–200)
HDL: 61.2 mg/dL (ref >39.00)
LDL Cholesterol: 106 mg/dL — ABNORMAL HIGH (ref 0–99)
NonHDL: 121.85
Total CHOL/HDL Ratio: 3
Triglycerides: 78 mg/dL (ref 0.0–149.0)
VLDL: 15.6 mg/dL (ref 0.0–40.0)

## 2021-09-15 LAB — HEMOGLOBIN A1C: Hgb A1c MFr Bld: 6 % (ref 4.6–6.5)

## 2021-11-23 ENCOUNTER — Other Ambulatory Visit: Payer: Self-pay

## 2021-11-24 ENCOUNTER — Other Ambulatory Visit (HOSPITAL_BASED_OUTPATIENT_CLINIC_OR_DEPARTMENT_OTHER): Payer: Self-pay

## 2021-11-24 ENCOUNTER — Other Ambulatory Visit: Payer: Self-pay | Admitting: Nurse Practitioner

## 2021-11-24 ENCOUNTER — Other Ambulatory Visit: Payer: Self-pay

## 2021-11-24 DIAGNOSIS — I10 Essential (primary) hypertension: Secondary | ICD-10-CM

## 2021-11-24 MED ORDER — AMLODIPINE BESYLATE 5 MG PO TABS
ORAL_TABLET | Freq: Every day | ORAL | 3 refills | Status: DC
  Filled 2021-11-24: qty 90, 90d supply, fill #0
  Filled 2022-04-24: qty 90, 90d supply, fill #1
  Filled 2022-08-06: qty 90, 90d supply, fill #2
  Filled 2022-10-30: qty 90, 90d supply, fill #3

## 2021-12-14 ENCOUNTER — Telehealth: Payer: Self-pay

## 2021-12-14 NOTE — Telephone Encounter (Signed)
-----   Message from Gerre Scull, NP sent at 12/11/2021  8:27 AM EST ----- Can you follow-up on her cologuard?  ----- Message ----- From: SYSTEM Sent: 12/11/2021  12:18 AM EST To: Gerre Scull, NP

## 2021-12-14 NOTE — Telephone Encounter (Signed)
LVM to see if Cologuard was completed. If so, we will request and abstract results into the chart. If not, we can help get arranged.

## 2022-01-02 ENCOUNTER — Telehealth: Payer: Self-pay | Admitting: Nurse Practitioner

## 2022-01-02 NOTE — Telephone Encounter (Signed)
Called & left Pt a VM asking to return our call back to schedule an appt to have her A1C checked

## 2022-01-02 NOTE — Telephone Encounter (Signed)
Pt want you to call her because she want to schedule A1C

## 2022-04-24 ENCOUNTER — Other Ambulatory Visit (HOSPITAL_BASED_OUTPATIENT_CLINIC_OR_DEPARTMENT_OTHER): Payer: Self-pay

## 2022-05-02 ENCOUNTER — Encounter: Payer: Self-pay | Admitting: Nurse Practitioner

## 2022-05-02 ENCOUNTER — Ambulatory Visit (INDEPENDENT_AMBULATORY_CARE_PROVIDER_SITE_OTHER): Payer: Commercial Managed Care - PPO | Admitting: Nurse Practitioner

## 2022-05-02 ENCOUNTER — Telehealth: Payer: Self-pay

## 2022-05-02 VITALS — BP 128/88 | HR 58 | Temp 97.9°F | Ht 59.0 in | Wt 149.0 lb

## 2022-05-02 DIAGNOSIS — R0683 Snoring: Secondary | ICD-10-CM

## 2022-05-02 DIAGNOSIS — R7303 Prediabetes: Secondary | ICD-10-CM | POA: Diagnosis not present

## 2022-05-02 DIAGNOSIS — I10 Essential (primary) hypertension: Secondary | ICD-10-CM | POA: Diagnosis not present

## 2022-05-02 LAB — CBC
HCT: 43.4 % (ref 36.0–46.0)
Hemoglobin: 14.6 g/dL (ref 12.0–15.0)
MCHC: 33.6 g/dL (ref 30.0–36.0)
MCV: 87.4 fl (ref 78.0–100.0)
Platelets: 216 10*3/uL (ref 150.0–400.0)
RBC: 4.96 Mil/uL (ref 3.87–5.11)
RDW: 12.8 % (ref 11.5–15.5)
WBC: 7.5 10*3/uL (ref 4.0–10.5)

## 2022-05-02 LAB — BASIC METABOLIC PANEL
BUN: 8 mg/dL (ref 6–23)
CO2: 30 mEq/L (ref 19–32)
Calcium: 9.4 mg/dL (ref 8.4–10.5)
Chloride: 103 mEq/L (ref 96–112)
Creatinine, Ser: 0.58 mg/dL (ref 0.40–1.20)
GFR: 105.06 mL/min (ref >60.00)
Glucose, Bld: 101 mg/dL — ABNORMAL HIGH (ref 70–99)
Potassium: 4.1 mEq/L (ref 3.5–5.1)
Sodium: 140 mEq/L (ref 135–145)

## 2022-05-02 LAB — HEMOGLOBIN A1C: Hgb A1c MFr Bld: 6.1 % (ref 4.6–6.5)

## 2022-05-02 NOTE — Assessment & Plan Note (Signed)
Check A1c today. Discussed nutrition and exercise.

## 2022-05-02 NOTE — Progress Notes (Signed)
Established Patient Office Visit  Subjective   Patient ID: Kaitlyn Bennett, female    DOB: Mar 21, 1970  Age: 52 y.o. MRN: [id number]  Chief Complaint  Patient presents with   Hypertension    And prediabetes with labs    HPI  Kaitlyn Bennett is here to follow-up on prediabetes and hypertension. She also notes that her daughter said that she snores at nights and sometimes even stops breathing at times while sleeping. 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   HYPERTENSION  Hypertension status: controlled  Satisfied with current treatment? yes Duration of hypertension: chronic BP monitoring frequency:  not checking BP range: n/a BP medication side effects:  no Medication compliance: excellent compliance Previous BP meds: amlodipine Aspirin: no Recurrent headaches: no Visual changes: no Palpitations: no Dyspnea: no Chest pain: no Lower extremity edema: no Dizzy/lightheaded: no    ROS See pertinent positives and negatives per HPI.    Objective:     BP 128/88 (BP Location: Left Arm)   Pulse (!) 58   Temp 97.9 F (36.6 C)   Ht 4\' 11"  (1.499 m)   Wt 149 lb (67.6 kg)   SpO2 98%   BMI 30.09 kg/m    Physical Exam Vitals and nursing note reviewed.  Constitutional:      General: She is not in acute distress.    Appearance: Normal appearance.  HENT:     Head: Normocephalic.  Eyes:     Conjunctiva/sclera: Conjunctivae normal.  Cardiovascular:     Rate and Rhythm: Normal rate and regular rhythm.     Pulses: Normal pulses.     Heart sounds: Normal heart sounds.  Pulmonary:     Effort: Pulmonary effort is normal.     Breath sounds: Normal breath sounds.  Musculoskeletal:     Cervical back: Normal range of motion.  Skin:    General: Skin is warm.  Neurological:     General: No focal deficit present.     Mental Status: She is alert and oriented to person, place, and time.  Psychiatric:        Mood and Affect: Mood  normal.        Behavior: Behavior normal.        Thought Content: Thought content normal.        Judgment: Judgment normal.      Results for orders placed or performed in visit on 05/02/22  CBC  Result Value Ref Range   WBC 7.5 4.0 - 10.5 K/uL   RBC 4.96 3.87 - 5.11 Mil/uL   Platelets 216.0 150.0 - 400.0 K/uL   Hemoglobin 14.6 12.0 - 15.0 g/dL   HCT 43.4 36.0 - 46.0 %   MCV 87.4 78.0 - 100.0 fl   MCHC 33.6 30.0 - 36.0 g/dL   RDW 12.8 11.5 - [id number] %  Basic metabolic panel  Result Value Ref Range   Sodium 140 135 - 145 mEq/L   Potassium 4.1 3.5 - 5.1 mEq/L   Chloride 103 96 - 112 mEq/L   CO2 30 19 - 32 mEq/L   Glucose, Bld 101 (H) 70 - 99 mg/dL   BUN 8 6 - 23 mg/dL   Creatinine, Ser 0.58 0.40 - 1.20 mg/dL   GFR 105.06 >60.00 mL/min   Calcium 9.4 8.4 - 10.5 mg/dL  Hemoglobin A1c  Result Value Ref Range   Hgb A1c MFr Bld 6.1 4.6 - 6.5 %      The 10-year  ASCVD risk score (Arnett DK, et al., 2019) is: 1.4%    Assessment & Plan:   Problem List Items Addressed This Visit       Cardiovascular and Mediastinum   Primary hypertension - Primary    Chronic, stable. Continue amlodipine 5 mg daily. Check BMP, CBC today. Follow-up in 6 months      Relevant Orders   CBC (Completed)   Basic metabolic panel (Completed)     Other   Prediabetes    Check A1c today. Discussed nutrition and exercise.       Relevant Orders   Hemoglobin A1c (Completed)   Snoring    Daughter noticed snoring and apneic spells while she was sleeping. Will place referral to sleep medicine for sleep study.       Relevant Orders   Ambulatory referral to Sleep Studies    Return in about 6 months (around 11/02/2022) for CPE.    Charyl Dancer, NP

## 2022-05-02 NOTE — Assessment & Plan Note (Signed)
Daughter noticed snoring and apneic spells while she was sleeping. Will place referral to sleep medicine for sleep study.

## 2022-05-02 NOTE — Telephone Encounter (Signed)
I called [employer] to give the representative patient's information to get a [place] sent to patient. The representative asked that I asked patient to call them to verify information.  I called patient and LVM to call [employer] and gave the phone number.

## 2022-05-02 NOTE — Patient Instructions (Signed)
It was great to see you!  Call Dr. Pamala Hurry to schedule your pap and mammogram.   We are checking your labs today and will let you know the results via mychart/phone.   We are calling [employer] to get you a knew cologuard.   I have placed a referral to sleep medicine for a sleep study  Look and see when your last tetanus was  Let's follow-up in 6 months, sooner if you have concerns.  If a referral was placed today, you will be contacted for an appointment. Please note that routine referrals can sometimes take up to 3-4 weeks to process. Please call our office if you haven't heard anything after this time frame.  Take care,  Vance Peper, NP

## 2022-05-02 NOTE — Assessment & Plan Note (Signed)
Chronic, stable. Continue amlodipine 5 mg daily. Check BMP, CBC today. Follow-up in 6 months

## 2022-08-28 DIAGNOSIS — Z1231 Encounter for screening mammogram for malignant neoplasm of breast: Secondary | ICD-10-CM | POA: Diagnosis not present

## 2022-09-19 ENCOUNTER — Encounter: Payer: Self-pay | Admitting: Nurse Practitioner

## 2022-09-19 ENCOUNTER — Other Ambulatory Visit (HOSPITAL_BASED_OUTPATIENT_CLINIC_OR_DEPARTMENT_OTHER): Payer: Self-pay

## 2022-09-19 ENCOUNTER — Encounter: Payer: Self-pay | Admitting: Pharmacist

## 2022-09-19 ENCOUNTER — Ambulatory Visit: Payer: Commercial Managed Care - PPO | Admitting: Nurse Practitioner

## 2022-09-19 VITALS — BP 136/88 | HR 62 | Temp 97.7°F | Ht 59.0 in | Wt 147.0 lb

## 2022-09-19 DIAGNOSIS — E663 Overweight: Secondary | ICD-10-CM | POA: Diagnosis not present

## 2022-09-19 DIAGNOSIS — I10 Essential (primary) hypertension: Secondary | ICD-10-CM | POA: Diagnosis not present

## 2022-09-19 DIAGNOSIS — R7303 Prediabetes: Secondary | ICD-10-CM

## 2022-09-19 DIAGNOSIS — E559 Vitamin D deficiency, unspecified: Secondary | ICD-10-CM

## 2022-09-19 DIAGNOSIS — Z1211 Encounter for screening for malignant neoplasm of colon: Secondary | ICD-10-CM

## 2022-09-19 DIAGNOSIS — R0683 Snoring: Secondary | ICD-10-CM | POA: Diagnosis not present

## 2022-09-19 LAB — COMPREHENSIVE METABOLIC PANEL
ALT: 20 U/L (ref 0–35)
AST: 18 U/L (ref 0–37)
Albumin: 4.6 g/dL (ref 3.5–5.2)
Alkaline Phosphatase: 58 U/L (ref 39–117)
BUN: 10 mg/dL (ref 6–23)
CO2: 28 mEq/L (ref 19–32)
Calcium: 9.1 mg/dL (ref 8.4–10.5)
Chloride: 101 mEq/L (ref 96–112)
Creatinine, Ser: 0.59 mg/dL (ref 0.40–1.20)
GFR: 104.34 mL/min (ref >60.00)
Glucose, Bld: 97 mg/dL (ref 70–99)
Potassium: 3.9 mEq/L (ref 3.5–5.1)
Sodium: 136 mEq/L (ref 135–145)
Total Bilirubin: 0.5 mg/dL (ref 0.2–1.2)
Total Protein: 6.9 g/dL (ref 6.0–8.3)

## 2022-09-19 LAB — CBC WITH DIFFERENTIAL/PLATELET
Basophils Absolute: 0 10*3/uL (ref 0.0–0.1)
Basophils Relative: 0.6 % (ref 0.0–3.0)
Eosinophils Absolute: 0.1 10*3/uL (ref 0.0–0.7)
Eosinophils Relative: 1.4 % (ref 0.0–5.0)
HCT: 45 % (ref 36.0–46.0)
Hemoglobin: 14.9 g/dL (ref 12.0–15.0)
Lymphocytes Relative: 28.7 % (ref 12.0–46.0)
Lymphs Abs: 1.8 10*3/uL (ref 0.7–4.0)
MCHC: 33.2 g/dL (ref 30.0–36.0)
MCV: 88.4 fl (ref 78.0–100.0)
Monocytes Absolute: 0.4 10*3/uL (ref 0.1–1.0)
Monocytes Relative: 7.2 % (ref 3.0–12.0)
Neutro Abs: 3.9 10*3/uL (ref 1.4–7.7)
Neutrophils Relative %: 62.1 % (ref 43.0–77.0)
Platelets: 221 10*3/uL (ref 150.0–400.0)
RBC: 5.09 Mil/uL (ref 3.87–5.11)
RDW: 13.2 % (ref 11.5–15.5)
WBC: 6.3 10*3/uL (ref 4.0–10.5)

## 2022-09-19 LAB — LIPID PANEL
Cholesterol: 205 mg/dL — ABNORMAL HIGH (ref 0–200)
HDL: 55.3 mg/dL (ref >39.00)
LDL Cholesterol: 136 mg/dL — ABNORMAL HIGH (ref 0–99)
NonHDL: 149.73
Total CHOL/HDL Ratio: 4
Triglycerides: 71 mg/dL (ref 0.0–149.0)
VLDL: 14.2 mg/dL (ref 0.0–40.0)

## 2022-09-19 LAB — VITAMIN D 25 HYDROXY (VIT D DEFICIENCY, FRACTURES): VITD: 68.9 ng/mL (ref 30.00–100.00)

## 2022-09-19 LAB — HEMOGLOBIN A1C: Hgb A1c MFr Bld: 5.8 % (ref 4.6–6.5)

## 2022-09-19 MED ORDER — ZEPBOUND 2.5 MG/0.5ML ~~LOC~~ SOAJ
2.5000 mg | SUBCUTANEOUS | 0 refills | Status: DC
  Filled 2022-09-19: qty 2, 28d supply, fill #0

## 2022-09-19 NOTE — Assessment & Plan Note (Signed)
BMI is 29.7.  She does have a history of hypertension and prediabetes.  She is interested in weight loss medication in addition to diet and nutrition changes that she has been making already.  Will have her start Zepbound 2.5 mg injection weekly.  Discussed that there is a coupon that she can get to help bring down the price.  Discussed possible side effects.  Continue drinking plenty of water and making nutritional changes.  Follow-up in 6 weeks.

## 2022-09-19 NOTE — Patient Instructions (Signed)
It was great to see you!  You were referred to sleep medicine. You can call them, let me know if they want a new referral: Address: [address] [place], [place], [place] [address] Phone: [phone]  We are checking your labs today and will let you know the results via mychart/phone.   Start zepbound once a week. Make sure you are drinking plenty of water. You can use miralax as needed for constipation.   Let's follow-up in 6 weeks, sooner if you have concerns.  If a referral was placed today, you will be contacted for an appointment. Please note that routine referrals can sometimes take up to 3-4 weeks to process. Please call our office if you haven't heard anything after this time frame.  Take care,  Rodman Pickle, NP

## 2022-09-19 NOTE — Assessment & Plan Note (Signed)
She is still having episodes of snoring and will sometimes wake up gasping.  Gave her the phone number to call for the sleep medicine for sleep study.

## 2022-09-19 NOTE — Progress Notes (Signed)
Established Patient Office Visit  Subjective   Patient ID: Kaitlyn Bennett, female    DOB: 10-10-1970  Age: 52 y.o. MRN: [id number]  Chief Complaint  Patient presents with   Discuss Lab Work    Concerns with A1C, request lab work to check lipids, patient is fasting    HPI  Kaitlyn Bennett is here to follow-up on prediabetes, hypertension, and weight management.  She states that she checks her blood pressure once in a while, but does not like to take it while she is at work.  She has been taking the amlodipine 5 mg daily most days and is not having any side effects to it.  She denies chest pain and shortness of breath.  She has been trying to decrease the amount of carbs that she is eating to help with her sugars and her weight.  She has been doing intermittent fasting as well which she states is working well for her over the last month.  She has lost 2 pounds.  She states that she only eats rice once a day.  She is interested in medication to help with weight loss.  She states that she is still snoring and will wake up gasping at times.  She was referred to sleep medicine for sleep study last visit, however she states that she never called them back to schedule an appointment.  She is interested in this and will schedule an appointment.    ROS See pertinent positives and negatives per HPI.    Objective:     BP 136/88 (BP Location: Left Arm)   Pulse 62   Temp 97.7 F (36.5 C)   Ht 4\' 11"  (1.499 m)   Wt 147 lb (66.7 kg)   LMP 08/18/2022   SpO2 99%   BMI 29.69 kg/m  BP Readings from Last 3 Encounters:  09/19/22 136/88  05/02/22 128/88  09/12/21 128/84   Wt Readings from Last 3 Encounters:  09/19/22 147 lb (66.7 kg)  05/02/22 149 lb (67.6 kg)  09/12/21 147 lb 3.2 oz (66.8 kg)      Physical Exam Vitals and nursing note reviewed.  Constitutional:      General: She is not in acute distress.    Appearance: Normal appearance.  HENT:     Head: Normocephalic.   Eyes:     Conjunctiva/sclera: Conjunctivae normal.  Cardiovascular:     Rate and Rhythm: Normal rate and regular rhythm.     Pulses: Normal pulses.     Heart sounds: Normal heart sounds.  Pulmonary:     Effort: Pulmonary effort is normal.     Breath sounds: Normal breath sounds.  Musculoskeletal:     Cervical back: Normal range of motion.  Skin:    General: Skin is warm.  Neurological:     General: No focal deficit present.     Mental Status: She is alert and oriented to person, place, and time.  Psychiatric:        Mood and Affect: Mood normal.        Behavior: Behavior normal.        Thought Content: Thought content normal.        Judgment: Judgment normal.    The 10-year ASCVD risk score (Arnett DK, et al., 2019) is: 1.6%    Assessment & Plan:   Problem List Items Addressed This Visit       Cardiovascular and Mediastinum   Primary hypertension - Primary    Chronic, stable.  BP today 136/88.  Continue amlodipine 5 mg daily.  Check CMP, CBC, lipid panel today.  Follow-up in 6 months.      Relevant Orders   CBC with Differential/Platelet   Comprehensive metabolic panel   Lipid panel     Other   Prediabetes    Most recent A1c was 6.1%.  Will repeat this today.  Continue focusing on nutrition and weight loss along with limiting carbohydrates.      Relevant Orders   Hemoglobin A1c   Snoring    She is still having episodes of snoring and will sometimes wake up gasping.  Gave her the phone number to call for the sleep medicine for sleep study.      Overweight    BMI is 29.7.  She does have a history of hypertension and prediabetes.  She is interested in weight loss medication in addition to diet and nutrition changes that she has been making already.  Will have her start Zepbound 2.5 mg injection weekly.  Discussed that there is a coupon that she can get to help bring down the price.  Discussed possible side effects.  Continue drinking plenty of water and making  nutritional changes.  Follow-up in 6 weeks.      Other Visit Diagnoses     Vitamin D insufficiency       Will check vitamin D levels today and treat based on results   Relevant Orders   VITAMIN D 25 Hydroxy (Vit-D Deficiency, Fractures)   Screen for colon cancer       Cologuard reordered today since the order expired.  Encouraged her to verify the expiration date on the kit before mailing it in.   Relevant Orders   Cologuard       Return in about 6 weeks (around 10/31/2022) for CPE.    Gerre Scull, NP

## 2022-09-19 NOTE — Assessment & Plan Note (Signed)
Chronic, stable.  BP today 136/88.  Continue amlodipine 5 mg daily.  Check CMP, CBC, lipid panel today.  Follow-up in 6 months.

## 2022-09-19 NOTE — Assessment & Plan Note (Signed)
Most recent A1c was 6.1%.  Will repeat this today.  Continue focusing on nutrition and weight loss along with limiting carbohydrates.

## 2022-09-20 ENCOUNTER — Other Ambulatory Visit (HOSPITAL_BASED_OUTPATIENT_CLINIC_OR_DEPARTMENT_OTHER): Payer: Self-pay

## 2022-10-02 DIAGNOSIS — Z1211 Encounter for screening for malignant neoplasm of colon: Secondary | ICD-10-CM | POA: Diagnosis not present

## 2022-10-09 LAB — COLOGUARD: COLOGUARD: NEGATIVE

## 2022-10-22 ENCOUNTER — Other Ambulatory Visit: Payer: Self-pay | Admitting: Nurse Practitioner

## 2022-10-23 ENCOUNTER — Other Ambulatory Visit (HOSPITAL_BASED_OUTPATIENT_CLINIC_OR_DEPARTMENT_OTHER): Payer: Self-pay

## 2022-10-23 MED ORDER — ZEPBOUND 2.5 MG/0.5ML ~~LOC~~ SOAJ
2.5000 mg | SUBCUTANEOUS | 0 refills | Status: DC
  Filled 2022-10-23: qty 2, 28d supply, fill #0

## 2023-02-22 ENCOUNTER — Other Ambulatory Visit (HOSPITAL_BASED_OUTPATIENT_CLINIC_OR_DEPARTMENT_OTHER): Payer: Self-pay

## 2023-02-22 ENCOUNTER — Other Ambulatory Visit: Payer: Self-pay | Admitting: Nurse Practitioner

## 2023-02-22 DIAGNOSIS — I10 Essential (primary) hypertension: Secondary | ICD-10-CM

## 2023-02-22 MED ORDER — AMLODIPINE BESYLATE 5 MG PO TABS
ORAL_TABLET | Freq: Every day | ORAL | 0 refills | Status: DC
Start: 2023-02-22 — End: 2023-03-25
  Filled 2023-02-22: qty 30, 30d supply, fill #0

## 2023-02-22 NOTE — Telephone Encounter (Signed)
Requesting: amLODipine (NORVASC) 5 MG tablet  Last Visit: 09/19/2022 per last note patient to follow up in 6 weeks for a CPE Next Visit: Visit date not found Last Refill: 11/24/2021  Please Advise    I called patient and LVM to call the office and schedule a CPE

## 2023-03-12 ENCOUNTER — Other Ambulatory Visit (HOSPITAL_BASED_OUTPATIENT_CLINIC_OR_DEPARTMENT_OTHER): Payer: Self-pay

## 2023-03-12 ENCOUNTER — Other Ambulatory Visit: Payer: Self-pay | Admitting: Nurse Practitioner

## 2023-03-12 MED ORDER — ZEPBOUND 2.5 MG/0.5ML ~~LOC~~ SOAJ
2.5000 mg | SUBCUTANEOUS | 0 refills | Status: DC
  Filled 2023-03-12: qty 2, 28d supply, fill #0

## 2023-03-12 NOTE — Telephone Encounter (Signed)
 Copied from CRM [id number]. Topic: Clinical - Medication Refill >> Mar 12, 2023  9:42 AM Latavia C wrote: Most Recent Primary Care Visit:  Provider: MCELWEE, LAUREN A  Department: [facility]  Visit Type: OFFICE VISIT  Date: 09/19/2022  Medication: tirzepatide  (ZEPBOUND ) 2.5 MG/0.5ML Pen  Has the patient contacted their pharmacy? Yes (Agent: If no, request that the patient contact the pharmacy for the refill. If patient does not wish to contact the pharmacy document the reason why and proceed with request.) (Agent: If yes, when and what did the pharmacy advise?)  Is this the correct pharmacy for this prescription? Yes If no, delete pharmacy and type the correct one.  This is the patient's preferred pharmacy:  [facility] [place] [facility] [address], [address] [place] [address] Phone: [phone] Fax: [phone]   Has the prescription been filled recently? No  Is the patient out of the medication? Yes  Has the patient been seen for an appointment in the last year OR does the patient have an upcoming appointment? Yes  Can we respond through MyChart? Yes  Agent: Please be advised that Rx refills may take up to 3 business days. We ask that you follow-up with your pharmacy.

## 2023-03-21 ENCOUNTER — Ambulatory Visit: Payer: Commercial Managed Care - PPO | Admitting: Nurse Practitioner

## 2023-03-25 ENCOUNTER — Other Ambulatory Visit: Payer: Self-pay

## 2023-03-25 ENCOUNTER — Other Ambulatory Visit: Payer: Self-pay | Admitting: Nurse Practitioner

## 2023-03-25 ENCOUNTER — Other Ambulatory Visit (HOSPITAL_BASED_OUTPATIENT_CLINIC_OR_DEPARTMENT_OTHER): Payer: Self-pay

## 2023-03-25 DIAGNOSIS — I10 Essential (primary) hypertension: Secondary | ICD-10-CM

## 2023-03-25 MED ORDER — AMLODIPINE BESYLATE 5 MG PO TABS
ORAL_TABLET | Freq: Every day | ORAL | 0 refills | Status: DC
Start: 2023-03-25 — End: 2023-05-02
  Filled 2023-03-25: qty 30, 30d supply, fill #0

## 2023-04-09 ENCOUNTER — Other Ambulatory Visit: Payer: Self-pay | Admitting: Family

## 2023-04-11 ENCOUNTER — Other Ambulatory Visit (HOSPITAL_BASED_OUTPATIENT_CLINIC_OR_DEPARTMENT_OTHER): Payer: Self-pay

## 2023-04-23 DIAGNOSIS — H5211 Myopia, right eye: Secondary | ICD-10-CM | POA: Diagnosis not present

## 2023-05-02 ENCOUNTER — Ambulatory Visit: Payer: Commercial Managed Care - PPO | Admitting: Nurse Practitioner

## 2023-05-02 ENCOUNTER — Encounter: Payer: Self-pay | Admitting: Nurse Practitioner

## 2023-05-02 ENCOUNTER — Other Ambulatory Visit (HOSPITAL_BASED_OUTPATIENT_CLINIC_OR_DEPARTMENT_OTHER): Payer: Self-pay

## 2023-05-02 VITALS — BP 122/82 | HR 50 | Temp 97.1°F | Ht 59.0 in | Wt 142.6 lb

## 2023-05-02 DIAGNOSIS — G8929 Other chronic pain: Secondary | ICD-10-CM

## 2023-05-02 DIAGNOSIS — R7303 Prediabetes: Secondary | ICD-10-CM | POA: Diagnosis not present

## 2023-05-02 DIAGNOSIS — R0683 Snoring: Secondary | ICD-10-CM | POA: Diagnosis not present

## 2023-05-02 DIAGNOSIS — M546 Pain in thoracic spine: Secondary | ICD-10-CM

## 2023-05-02 DIAGNOSIS — E663 Overweight: Secondary | ICD-10-CM

## 2023-05-02 DIAGNOSIS — I10 Essential (primary) hypertension: Secondary | ICD-10-CM | POA: Diagnosis not present

## 2023-05-02 MED ORDER — TIRZEPATIDE-WEIGHT MANAGEMENT 2.5 MG/0.5ML ~~LOC~~ SOLN: 2.5000 mg | SUBCUTANEOUS | 0 refills | Status: DC

## 2023-05-02 MED ORDER — AMLODIPINE BESYLATE 5 MG PO TABS
5.0000 mg | ORAL_TABLET | Freq: Two times a day (BID) | ORAL | 3 refills | Status: AC
  Filled 2023-05-02: qty 180, 90d supply, fill #0
  Filled 2023-10-04: qty 180, 90d supply, fill #1

## 2023-05-02 NOTE — Progress Notes (Unsigned)
   Established Patient Office Visit  Subjective   Patient ID: Kaitlyn Bennett, female    DOB: 1970/04/20  Age: 53 y.o. MRN: [id number]  Chief Complaint  Patient presents with   Weight management and left shoulder pain    Follow up. Wants to start back on Zepbound    HPI  {History (Optional):23778}  ROS    Objective:     BP 122/82 (BP Location: Left Arm, Patient Position: Sitting, Cuff Size: Normal)   Pulse (!) 50   Temp (!) 97.1 F (36.2 C)   Ht 4\' 11"  (1.499 m)   Wt 142 lb 9.6 oz (64.7 kg)   SpO2 98%   BMI 28.80 kg/m  BP Readings from Last 3 Encounters:  05/02/23 122/82  09/19/22 136/88  05/02/22 128/88   Wt Readings from Last 3 Encounters:  05/02/23 142 lb 9.6 oz (64.7 kg)  09/19/22 147 lb (66.7 kg)  05/02/22 149 lb (67.6 kg)      Physical Exam   No results found for any visits on 05/02/23.  {Labs (Optional):23779}  The 10-year ASCVD risk score (Arnett DK, et al., 2019) is: 1.9%    Assessment & Plan:   Problem List Items Addressed This Visit       Cardiovascular and Mediastinum   Essential hypertension - Primary   Relevant Medications   amLODipine (NORVASC) 5 MG tablet   tirzepatide (ZEPBOUND) 2.5 MG/0.5ML injection vial     Other   Prediabetes   Relevant Medications   tirzepatide (ZEPBOUND) 2.5 MG/0.5ML injection vial   Snoring   Overweight   Relevant Medications   tirzepatide (ZEPBOUND) 2.5 MG/0.5ML injection vial    No follow-ups on file.    Gerre Scull, NP

## 2023-05-02 NOTE — Patient Instructions (Signed)
 It was great to see you!  Start zepbound injection once a week   You can take a second dose of your amlodipine at night as needed for blood pressure greater than 135/90  I have placed a referral to sleep medicine.   Let's follow-up in 3 months, sooner if you have concerns.  If a referral was placed today, you will be contacted for an appointment. Please note that routine referrals can sometimes take up to 3-4 weeks to process. Please call our office if you haven't heard anything after this time frame.  Take care,  Rodman Pickle, NP

## 2023-05-03 DIAGNOSIS — G8929 Other chronic pain: Secondary | ICD-10-CM | POA: Insufficient documentation

## 2023-05-03 NOTE — Assessment & Plan Note (Signed)
 Elevated blood pressure, particularly at night, may be influenced by stress. She is currently on amlodipine 5 mg daily. Will have her continue amlodipine 5mg  daily and then she can take a second dose of amlodipine 5mg  as needed for blood pressure over 135/90. Monitor for pedal edema as a side effect of the increased dosage.

## 2023-05-03 NOTE — Assessment & Plan Note (Signed)
 She has lost five pounds with Zepbound and would like to continue this. Will refill zepbound 2.5mg  injection weekly. Continue focus on nutrition and exercise.

## 2023-05-03 NOTE — Assessment & Plan Note (Signed)
 Chronic, stable. Continue focusing on nutrition and weight loss along with limiting carbohydrates.

## 2023-05-03 NOTE — Assessment & Plan Note (Signed)
 Chronic muscular back pain, likely work-related, has improved with exercises and a massager. Continue exercises and use of the massager.

## 2023-05-03 NOTE — Assessment & Plan Note (Signed)
 Symptoms suggest sleep apnea. Plan for a sleep study after her vacation. Refer to sleep medicine for the study

## 2023-05-08 ENCOUNTER — Telehealth: Payer: Self-pay

## 2023-05-08 NOTE — Telephone Encounter (Signed)
 Forwarding message below

## 2023-05-08 NOTE — Telephone Encounter (Signed)
 Copied from CRM [id number]. Topic: Clinical - Medication Question >> May 08, 2023  9:17 AM Adaysia C wrote: Reason for CRM: Patient called to ask her provider if she needs to be seen in the office to have her tirzepatide (ZEPBOUND) 2.5 MG/0.5ML injection vial refilled or if she can submit the RX refill by phone; checked patients office visit notes from 05/02/2023 and it is only showing a 3 month f/u should be made for this medication; RX prescription is showing 0 refills; please follow up with patient by sending message to her My Chart

## 2023-05-08 NOTE — Telephone Encounter (Signed)
 Requesting: Zepbound Last Visit: 05/02/2023 Next Visit: Visit date not found Last Refill: 05/02/2023  Please Advise    I called patient and left a message to return call.

## 2023-05-09 NOTE — Telephone Encounter (Signed)
 Left message for patient to return call.

## 2023-05-10 NOTE — Telephone Encounter (Signed)
 Left message for patient to return call.

## 2023-05-20 ENCOUNTER — Other Ambulatory Visit: Payer: Self-pay | Admitting: Nurse Practitioner

## 2023-05-20 DIAGNOSIS — I10 Essential (primary) hypertension: Secondary | ICD-10-CM

## 2023-05-20 DIAGNOSIS — E663 Overweight: Secondary | ICD-10-CM

## 2023-05-20 DIAGNOSIS — R7303 Prediabetes: Secondary | ICD-10-CM

## 2023-05-20 NOTE — Telephone Encounter (Signed)
 Requesting: ZEPBOUND 2.5 MG/0.5ML SUBCUTANEOUS SOLUTION  Last Visit: 05/02/2023 Next Visit: Visit date not found Last Refill: 05/02/2023  Please Advise

## 2023-05-22 ENCOUNTER — Other Ambulatory Visit: Payer: Self-pay | Admitting: Nurse Practitioner

## 2023-05-22 DIAGNOSIS — I10 Essential (primary) hypertension: Secondary | ICD-10-CM

## 2023-05-22 DIAGNOSIS — R7303 Prediabetes: Secondary | ICD-10-CM

## 2023-05-22 DIAGNOSIS — E663 Overweight: Secondary | ICD-10-CM

## 2023-05-22 MED ORDER — ZEPBOUND 2.5 MG/0.5ML ~~LOC~~ SOLN: 2.5000 mg | SUBCUTANEOUS | 0 refills | Status: DC

## 2023-05-22 NOTE — Telephone Encounter (Signed)
 Copied from CRM [id number]. Topic: Clinical - Medication Refill >> May 22, 2023  8:16 AM Marlan Silva wrote: Most Recent Primary Care Visit:  Provider: Rheba Cedar A  Department: [facility]  Visit Type: OFFICE VISIT  Date: 05/02/2023  Medication: ZEPBOUND 2.5 MG/0.5ML injection vial  Has the patient contacted their pharmacy? Yes (Agent: If no, request that the patient contact the pharmacy for the refill. If patient does not wish to contact the pharmacy document the reason why and proceed with request.) (Agent: If yes, when and what did the pharmacy advise?)  Is this the correct pharmacy for this prescription? Yes If no, delete pharmacy and type the correct one.  This is the patient's preferred pharmacy:  [facility] [place] [facility] [address], [address] [place] [address] Phone: [phone] Fax: [phone]  [facility] [place], [place] - [id number] [place] [address] Phone: [phone] Fax: [phone]   Has the prescription been filled recently? No  Is the patient out of the medication? Yes  Has the patient been seen for an appointment in the last year OR does the patient have an upcoming appointment? Yes  Can we respond through MyChart? Yes  Agent: Please be advised that Rx refills may take up to 3 business days. We ask that you follow-up with your pharmacy.

## 2023-05-22 NOTE — Telephone Encounter (Signed)
 I called and spoke with agent at [facility] and they have not received Rx of Zepbound. He said that it normally takes 72 hours for them to receive an electronic prescription.

## 2023-06-10 ENCOUNTER — Other Ambulatory Visit: Payer: Self-pay | Admitting: Nurse Practitioner

## 2023-06-10 DIAGNOSIS — R7303 Prediabetes: Secondary | ICD-10-CM

## 2023-06-10 DIAGNOSIS — E663 Overweight: Secondary | ICD-10-CM

## 2023-06-10 DIAGNOSIS — I10 Essential (primary) hypertension: Secondary | ICD-10-CM

## 2023-06-10 NOTE — Telephone Encounter (Signed)
 Left message for patient to return call.

## 2023-06-10 NOTE — Telephone Encounter (Signed)
 Requesting: ZEPBOUND  2.5 MG/0.5ML SUBCUTANEOUS SOLUTION  Last Visit: 05/02/2023 Next Visit: Visit date not found Last Refill: 05/22/2023  Please Advise

## 2023-06-11 NOTE — Telephone Encounter (Signed)
 Left message for patient to return call.

## 2023-06-12 NOTE — Telephone Encounter (Signed)
 Copied from CRM [id number]. Topic: General - Other >> Jun 12, 2023  2:23 PM Aisha D wrote: Reason for CRM: Patient is calling in regards to a missed call from [name]. I informed the patient that [name] was assisting another patient and would call back once available. Patient stated that she is currently getting on a plane and would like for [name] to leave a detailed message on why she was calling .

## 2023-06-12 NOTE — Telephone Encounter (Signed)
 I called and spoke with patient and she would like to stay on 2.5mg  because it is working for her.  She has two more doses with her as she is boarding now to travel to the [place] and will need some when she gets [place] in the states.

## 2023-06-12 NOTE — Telephone Encounter (Signed)
 Left message for patient to return call.

## 2023-06-28 ENCOUNTER — Other Ambulatory Visit (HOSPITAL_BASED_OUTPATIENT_CLINIC_OR_DEPARTMENT_OTHER): Payer: Self-pay

## 2023-07-22 ENCOUNTER — Other Ambulatory Visit: Payer: Self-pay | Admitting: Nurse Practitioner

## 2023-07-22 DIAGNOSIS — I10 Essential (primary) hypertension: Secondary | ICD-10-CM

## 2023-07-22 DIAGNOSIS — E663 Overweight: Secondary | ICD-10-CM

## 2023-07-22 DIAGNOSIS — R7303 Prediabetes: Secondary | ICD-10-CM

## 2023-07-23 NOTE — Telephone Encounter (Signed)
 Requesting:  ZEPBOUND  2.5 MG/0.5ML SUBCUTANEOUS SOLUTION  Last Visit: 05/02/2023 Next Visit: Visit date not found Last Refill: 06/12/2023  Please Advise

## 2023-08-19 ENCOUNTER — Telehealth: Payer: Self-pay | Admitting: Nurse Practitioner

## 2023-08-19 DIAGNOSIS — R7303 Prediabetes: Secondary | ICD-10-CM

## 2023-08-19 DIAGNOSIS — I10 Essential (primary) hypertension: Secondary | ICD-10-CM

## 2023-08-19 DIAGNOSIS — E663 Overweight: Secondary | ICD-10-CM

## 2023-08-19 NOTE — Telephone Encounter (Signed)
 Copied from CRM [id number]. Topic: Clinical - Medication Refill >> Aug 19, 2023  8:54 AM Kaitlyn Bennett wrote: Medication: ZEPBOUND  2.5 MG/0.5ML injection vial  Pt discussed this with PCP and would like to increase the dose to 4MG . She has scheduled the soonest appt on 7/23  Has the patient contacted their pharmacy? Yes (Agent: If no, request that the patient contact the pharmacy for the refill. If patient does not wish to contact the pharmacy document the reason why and proceed with request.) (Agent: If yes, when and what did the pharmacy advise?)  This is the patient's preferred pharmacy:   [facility] [place], [place] - [id number] [name] [address] Phone: [phone] Fax: [phone]  Is this the correct pharmacy for this prescription? Yes If no, delete pharmacy and type the correct one.   Has the prescription been filled recently? No  Is the patient out of the medication? Yes  Has the patient been seen for an appointment in the last year OR does the patient have an upcoming appointment? Yes  Can we respond through MyChart? Yes  Agent: Please be advised that Rx refills may take up to 3 business days. We ask that you follow-up with your pharmacy.

## 2023-08-28 ENCOUNTER — Ambulatory Visit: Payer: Self-pay | Admitting: Nurse Practitioner

## 2023-08-28 ENCOUNTER — Encounter: Payer: Self-pay | Admitting: Nurse Practitioner

## 2023-08-28 ENCOUNTER — Ambulatory Visit: Admitting: Nurse Practitioner

## 2023-08-28 VITALS — BP 122/82 | HR 62 | Temp 97.3°F | Ht 59.0 in | Wt 142.4 lb

## 2023-08-28 DIAGNOSIS — R7303 Prediabetes: Secondary | ICD-10-CM

## 2023-08-28 DIAGNOSIS — M546 Pain in thoracic spine: Secondary | ICD-10-CM

## 2023-08-28 DIAGNOSIS — G8929 Other chronic pain: Secondary | ICD-10-CM | POA: Diagnosis not present

## 2023-08-28 DIAGNOSIS — I10 Essential (primary) hypertension: Secondary | ICD-10-CM

## 2023-08-28 DIAGNOSIS — Z0000 Encounter for general adult medical examination without abnormal findings: Secondary | ICD-10-CM | POA: Insufficient documentation

## 2023-08-28 DIAGNOSIS — Z114 Encounter for screening for human immunodeficiency virus [HIV]: Secondary | ICD-10-CM

## 2023-08-28 DIAGNOSIS — E663 Overweight: Secondary | ICD-10-CM

## 2023-08-28 LAB — COMPREHENSIVE METABOLIC PANEL WITH GFR
ALT: 18 U/L (ref 0–35)
AST: 15 U/L (ref 0–37)
Albumin: 4.5 g/dL (ref 3.5–5.2)
Alkaline Phosphatase: 56 U/L (ref 39–117)
BUN: 13 mg/dL (ref 6–23)
CO2: 28 meq/L (ref 19–32)
Calcium: 9.1 mg/dL (ref 8.4–10.5)
Chloride: 103 meq/L (ref 96–112)
Creatinine, Ser: 0.65 mg/dL (ref 0.40–1.20)
GFR: 101.26 mL/min (ref >60.00)
Glucose, Bld: 96 mg/dL (ref 70–99)
Potassium: 3.9 meq/L (ref 3.5–5.1)
Sodium: 140 meq/L (ref 135–145)
Total Bilirubin: 0.3 mg/dL (ref 0.2–1.2)
Total Protein: 7.1 g/dL (ref 6.0–8.3)

## 2023-08-28 LAB — LIPID PANEL
Cholesterol: 186 mg/dL (ref 0–200)
HDL: 52.1 mg/dL (ref >39.00)
LDL Cholesterol: 108 mg/dL — ABNORMAL HIGH (ref 0–99)
NonHDL: 133.57
Total CHOL/HDL Ratio: 4
Triglycerides: 129 mg/dL (ref 0.0–149.0)
VLDL: 25.8 mg/dL (ref 0.0–40.0)

## 2023-08-28 LAB — CBC WITH DIFFERENTIAL/PLATELET
Basophils Absolute: 0 K/uL (ref 0.0–0.1)
Basophils Relative: 0.7 % (ref 0.0–3.0)
Eosinophils Absolute: 0.1 K/uL (ref 0.0–0.7)
Eosinophils Relative: 1.9 % (ref 0.0–5.0)
HCT: 43.2 % (ref 36.0–46.0)
Hemoglobin: 14.4 g/dL (ref 12.0–15.0)
Lymphocytes Relative: 35 % (ref 12.0–46.0)
Lymphs Abs: 2.1 K/uL (ref 0.7–4.0)
MCHC: 33.3 g/dL (ref 30.0–36.0)
MCV: 87.7 fl (ref 78.0–100.0)
Monocytes Absolute: 0.4 K/uL (ref 0.1–1.0)
Monocytes Relative: 7.3 % (ref 3.0–12.0)
Neutro Abs: 3.4 K/uL (ref 1.4–7.7)
Neutrophils Relative %: 55.1 % (ref 43.0–77.0)
Platelets: 235 K/uL (ref 150.0–400.0)
RBC: 4.93 Mil/uL (ref 3.87–5.11)
RDW: 13.1 % (ref 11.5–15.5)
WBC: 6.1 K/uL (ref 4.0–10.5)

## 2023-08-28 LAB — TSH: TSH: 1.41 u[IU]/mL (ref 0.35–5.50)

## 2023-08-28 LAB — HEMOGLOBIN A1C: Hgb A1c MFr Bld: 5.7 % (ref 4.6–6.5)

## 2023-08-28 LAB — VITAMIN D 25 HYDROXY (VIT D DEFICIENCY, FRACTURES): VITD: 62.34 ng/mL (ref 30.00–100.00)

## 2023-08-28 MED ORDER — TIRZEPATIDE-WEIGHT MANAGEMENT 5 MG/0.5ML ~~LOC~~ SOLN: 5.0000 mg | SUBCUTANEOUS | 0 refills | Status: DC

## 2023-08-28 NOTE — Progress Notes (Signed)
 BP 122/82 (BP Location: Left Arm, Patient Position: Sitting, Cuff Size: Normal)   Pulse 62   Temp (!) 97.3 F (36.3 C)   Ht 4' 11 (1.499 m)   Wt 142 lb 6.4 oz (64.6 kg)   SpO2 98%   BMI 28.76 kg/m    Subjective:    Patient ID: Kaitlyn Bennett, female    DOB: 06-02-1970, 53 y.o.   MRN: [id number]  CC: Chief Complaint  Patient presents with   Annual Exam    With fasting labs, Rx Refill    HPI: Kaitlyn Bennett is a 53 y.o. female presenting on 08/28/2023 for comprehensive medical examination. Current medical complaints include:none  She currently lives with: husband, kids  Depression and Anxiety Screen done today and results listed below:     08/28/2023    8:58 AM 05/02/2023    2:59 PM 05/02/2022    8:52 AM 09/12/2021    9:37 AM 12/23/2019   11:07 AM  Depression screen PHQ 2/9  Decreased Interest 0 0 3 3 1   Down, Depressed, Hopeless 0 0 0 0 0  PHQ - 2 Score 0 0 3 3 1   Altered sleeping 1 1 1 3 1   Tired, decreased energy 1 1 1 3 1   Change in appetite 1 1 3 3 2   Feeling bad or failure about yourself  0 0 0 0 0  Trouble concentrating 0 0 0 0 0  Moving slowly or fidgety/restless 0 0 1 0 0  Suicidal thoughts 0 0 0 0 0  PHQ-9 Score 3 3 9 12 5   Difficult doing work/chores Not difficult at all Not difficult at all Not difficult at all  Not difficult at all      08/28/2023    8:59 AM 05/02/2023    2:59 PM 05/02/2022    8:52 AM 09/12/2021    9:37 AM  GAD 7 : Generalized Anxiety Score  Nervous, Anxious, on Edge 0 0 0 0  Control/stop worrying 0 0 0 1  Worry too much - different things 0 0 0   Trouble relaxing 0 0 0 1  Restless 0 0 0 1  Easily annoyed or irritable 0 0 0 1  Afraid - awful might happen 0 0 0 0  Total GAD 7 Score 0 0 0   Anxiety Difficulty Not difficult at all Not difficult at all Not difficult at all     The patient does not have a history of falls. I did not complete a risk assessment for falls. A plan of care for falls was not documented.   Past  Medical History:  Past Medical History:  Diagnosis Date   Hypertension     Surgical History:  Past Surgical History:  Procedure Laterality Date   CESAREAN SECTION     x2   OOPHORECTOMY Right    2000    Medications:  Current Outpatient Medications on File Prior to Visit  Medication Sig   amLODipine  (NORVASC ) 5 MG tablet Take 1 tablet (5 mg total) by mouth 2 (two) times daily. Take 1 tablet every morning and a second tablet at night if blood pressure greater than 135/90.   B Complex Vitamins (B-COMPLEX/B-12) TABS Take by mouth.   Coenzyme Q10 (COQ10 PO) Take by mouth daily.   Magnesium Glycinate 100 MG CAPS Take by mouth.   Multiple Vitamins-Minerals (ZINC PO) Take by mouth daily.   Omega-3 Fatty Acids (OMEGA-3 FISH OIL PO) Take by mouth daily.   OVER THE  COUNTER MEDICATION Take by mouth daily. (Patient taking differently: Take by mouth daily. [employer] Collagen and Biotin)   TURMERIC PO Take by mouth.   Ascorbic Acid (VITAMIN C PO) Take by mouth. (Patient not taking: Reported on 08/28/2023)   Calcium Carbonate-Vit D-Min (CALTRATE PLUS PO) Take by mouth. (Patient not taking: Reported on 08/28/2023)   OVER THE COUNTER MEDICATION MENO Talk 2 gummies daily (Patient not taking: Reported on 08/28/2023)   No current facility-administered medications on file prior to visit.    Allergies:  No Known Allergies  Social History:  Social History   Socioeconomic History   Marital status: Married    Spouse name: [name]   Number of children: 2   Years of education: Nursing   Highest education level: Not on file  Occupational History   Occupation: Academic librarian: [employer]   Occupation: Nurse    Employer: [employer]  Tobacco Use   Smoking status: Never   Smokeless tobacco: Never  Vaping Use   Vaping status: Never Used  Substance and Sexual Activity   Alcohol use: No    Alcohol/week: 0.0 standard drinks of alcohol   Drug use: No   Sexual  activity: Yes    Partners: Male    Birth control/protection: None  Other Topics Concern   Not on file  Social History Narrative   Lives with husband, [name] and two children.    Social Drivers of Corporate investment banker Strain: Not on file  Food Insecurity: Not on file  Transportation Needs: Not on file  Physical Activity: Not on file  Stress: Not on file  Social Connections: Not on file  Intimate Partner Violence: Not on file   Social History   Tobacco Use  Smoking Status Never  Smokeless Tobacco Never   Social History   Substance and Sexual Activity  Alcohol Use No   Alcohol/week: 0.0 standard drinks of alcohol    Family History:  Family History  Problem Relation Age of Onset   Hyperlipidemia Father    Hypertension Father     Past medical history, surgical history, medications, allergies, family history and social history reviewed with patient today and changes made to appropriate areas of the chart.   Review of Systems  Constitutional: Negative.   HENT: Negative.    Eyes: Negative.   Respiratory: Negative.    Cardiovascular: Negative.   Gastrointestinal: Negative.   Genitourinary: Negative.   Musculoskeletal: Negative.   Skin: Negative.   Neurological: Negative.   Psychiatric/Behavioral: Negative.     All other ROS negative except what is listed above and in the HPI.      Objective:    BP 122/82 (BP Location: Left Arm, Patient Position: Sitting, Cuff Size: Normal)   Pulse 62   Temp (!) 97.3 F (36.3 C)   Ht 4' 11 (1.499 m)   Wt 142 lb 6.4 oz (64.6 kg)   SpO2 98%   BMI 28.76 kg/m   Wt Readings from Last 3 Encounters:  08/28/23 142 lb 6.4 oz (64.6 kg)  05/02/23 142 lb 9.6 oz (64.7 kg)  09/19/22 147 lb (66.7 kg)    Physical Exam Vitals and nursing note reviewed.  Constitutional:      General: She is not in acute distress.    Appearance: Normal appearance.  HENT:     Head: Normocephalic and atraumatic.     Right Ear: Tympanic  membrane, ear canal and external ear normal.  Left Ear: Tympanic membrane, ear canal and external ear normal.     Mouth/Throat:     Mouth: Mucous membranes are moist.     Pharynx: No posterior oropharyngeal erythema.  Eyes:     Conjunctiva/sclera: Conjunctivae normal.  Cardiovascular:     Rate and Rhythm: Normal rate and regular rhythm.     Pulses: Normal pulses.     Heart sounds: Normal heart sounds.  Pulmonary:     Effort: Pulmonary effort is normal.     Breath sounds: Normal breath sounds.  Abdominal:     Palpations: Abdomen is soft.     Tenderness: There is no abdominal tenderness.  Musculoskeletal:        General: Normal range of motion.     Cervical back: Normal range of motion and neck supple.     Right lower leg: No edema.     Left lower leg: No edema.  Lymphadenopathy:     Cervical: No cervical adenopathy.  Skin:    General: Skin is warm and dry.  Neurological:     General: No focal deficit present.     Mental Status: She is alert and oriented to person, place, and time.     Cranial Nerves: No cranial nerve deficit.     Coordination: Coordination normal.     Gait: Gait normal.  Psychiatric:        Mood and Affect: Mood normal.        Behavior: Behavior normal.        Thought Content: Thought content normal.        Judgment: Judgment normal.     Results for orders placed or performed in visit on 09/19/22  CBC with Differential/Platelet   Collection Time: 09/19/22  9:47 AM  Result Value Ref Range   WBC 6.3 4.0 - 10.5 K/uL   RBC 5.09 3.87 - 5.11 Mil/uL   Hemoglobin 14.9 12.0 - 15.0 g/dL   HCT 54.9 63.9 - 53.9 %   MCV 88.4 78.0 - 100.0 fl   MCHC 33.2 30.0 - 36.0 g/dL   RDW 86.7 88.4 - 84.4 %   Platelets 221.0 150.0 - 400.0 K/uL   Neutrophils Relative % 62.1 43.0 - 77.0 %   Lymphocytes Relative 28.7 12.0 - 46.0 %   Monocytes Relative 7.2 3.0 - 12.0 %   Eosinophils Relative 1.4 0.0 - 5.0 %   Basophils Relative 0.6 0.0 - 3.0 %   Neutro Abs 3.9 1.4 - 7.7  K/uL   Lymphs Abs 1.8 0.7 - 4.0 K/uL   Monocytes Absolute 0.4 0.1 - 1.0 K/uL   Eosinophils Absolute 0.1 0.0 - 0.7 K/uL   Basophils Absolute 0.0 0.0 - 0.1 K/uL  Comprehensive metabolic panel   Collection Time: 09/19/22  9:47 AM  Result Value Ref Range   Sodium 136 135 - 145 mEq/L   Potassium 3.9 3.5 - 5.1 mEq/L   Chloride 101 96 - 112 mEq/L   CO2 28 19 - 32 mEq/L   Glucose, Bld 97 70 - 99 mg/dL   BUN 10 6 - 23 mg/dL   Creatinine, Ser 9.40 0.40 - 1.20 mg/dL   Total Bilirubin 0.5 0.2 - 1.2 mg/dL   Alkaline Phosphatase 58 39 - 117 U/L   AST 18 0 - 37 U/L   ALT 20 0 - 35 U/L   Total Protein 6.9 6.0 - 8.3 g/dL   Albumin 4.6 3.5 - 5.2 g/dL   GFR 895.65 >39.99 mL/min   Calcium 9.1 8.4 -  10.5 mg/dL  Lipid panel   Collection Time: 09/19/22  9:47 AM  Result Value Ref Range   Cholesterol 205 (H) 0 - 200 mg/dL   Triglycerides 28.9 0.0 - 149.0 mg/dL   HDL 44.69 >60.99 mg/dL   VLDL 85.7 0.0 - 59.9 mg/dL   LDL Cholesterol 863 (H) 0 - 99 mg/dL   Total CHOL/HDL Ratio 4    NonHDL 149.73   Hemoglobin A1c   Collection Time: 09/19/22  9:47 AM  Result Value Ref Range   Hgb A1c MFr Bld 5.8 4.6 - 6.5 %  VITAMIN D  25 Hydroxy (Vit-D Deficiency, Fractures)   Collection Time: 09/19/22  9:47 AM  Result Value Ref Range   VITD 68.90 30.00 - 100.00 ng/mL  Cologuard   Collection Time: 10/02/22  9:15 AM  Result Value Ref Range   COLOGUARD Negative Negative      Assessment & Plan:   Problem List Items Addressed This Visit       Cardiovascular and Mediastinum   Essential hypertension   Chronic, stable. Continue amlodipine  5mg  daily along with a second dose of amlodipine  if needed. Check CMP, CBC, lipid panel today.       Relevant Orders   CBC with Differential/Platelet   Comprehensive metabolic panel with GFR   Lipid panel   VITAMIN D  25 Hydroxy (Vit-D Deficiency, Fractures)   TSH     Other   Prediabetes   Chronic, stable. Continue focusing on nutrition and weight loss along with  limiting carbohydrates. Check A1c today      Relevant Orders   Hemoglobin A1c   Overweight   BMI 28.7. Continue focus on nutrition and exercise. Increase zepbound  to 5mg  weekly. Follow-up in 3 months.       Relevant Orders   VITAMIN D  25 Hydroxy (Vit-D Deficiency, Fractures)   Chronic left-sided thoracic back pain   Chronic, stable. Pain is well controlled. Continue with massaging and stretching.       Routine general medical examination at a health care facility - Primary   Health maintenance reviewed and updated. Discussed nutrition, exercise. Follow-up 1 year.        Other Visit Diagnoses       Screening for HIV (human immunodeficiency virus)       Relevant Orders   HIV Antibody (routine testing w rflx)        Follow up plan: Return in about 3 months (around 11/28/2023) for weight management .   LABORATORY TESTING:  - Pap smear: scheduling an appointment with GYN  IMMUNIZATIONS:   - Tdap: Tetanus vaccination status reviewed: will get at nurse visit - Influenza: Postponed to flu season - Pneumovax: Not applicable - Prevnar: Declined - HPV: Not applicable - Shingrix vaccine: Declined  SCREENING: -Mammogram: will get this week  - Colonoscopy: Up to date  - Bone Density: Not applicable   PATIENT COUNSELING:   Advised to take 1 mg of folate supplement per day if capable of pregnancy.   Sexuality: Discussed sexually transmitted diseases, partner selection, use of condoms, avoidance of unintended pregnancy  and contraceptive alternatives.   Advised to avoid cigarette smoking.  I discussed with the patient that most people either abstain from alcohol or drink within safe limits (<=14/week and <=4 drinks/occasion for males, <=7/weeks and <= 3 drinks/occasion for females) and that the risk for alcohol disorders and other health effects rises proportionally with the number of drinks per week and how often a drinker exceeds daily limits.  Discussed cessation/primary  prevention  of drug use and availability of treatment for abuse.   Diet: Encouraged to adjust caloric intake to maintain  or achieve ideal body weight, to reduce intake of dietary saturated fat and total fat, to limit sodium intake by avoiding high sodium foods and not adding table salt, and to maintain adequate dietary potassium and calcium preferably from fresh fruits, vegetables, and low-fat dairy products.    stressed the importance of regular exercise  Injury prevention: Discussed safety belts, safety helmets, smoke detector, smoking near bedding or upholstery.   Dental health: Discussed importance of regular tooth brushing, flossing, and dental visits.    NEXT PREVENTATIVE PHYSICAL DUE IN 1 YEAR. Return in about 3 months (around 11/28/2023) for weight management .  Rochelle Larue A Kraig Genis

## 2023-08-28 NOTE — Assessment & Plan Note (Signed)
 Chronic, stable. Continue focusing on nutrition and weight loss along with limiting carbohydrates. Check A1c today

## 2023-08-28 NOTE — Assessment & Plan Note (Signed)
 Chronic, stable. Pain is well controlled. Continue with massaging and stretching.

## 2023-08-28 NOTE — Patient Instructions (Addendum)
 It was great to see you!  We are checking your labs today and will let you know the results via mychart/phone.   Increase your zepbound  to 5mg  weekly - let me know if you want to go up again next month  Gene Connect:   Contact [facility] at [email or web address] or [phone] for inquiries about the research program or consent form.  Let's follow-up in 3 months, sooner if you have concerns.  If a referral was placed today, you will be contacted for an appointment. Please note that routine referrals can sometimes take up to 3-4 weeks to process. Please call our office if you haven't heard anything after this time frame.  Take care,  Tinnie Harada, NP

## 2023-08-28 NOTE — Assessment & Plan Note (Signed)
 BMI 28.7. Continue focus on nutrition and exercise. Increase zepbound  to 5mg  weekly. Follow-up in 3 months.

## 2023-08-28 NOTE — Assessment & Plan Note (Signed)
Health maintenance reviewed and updated. Discussed nutrition, exercise. Follow-up 1 year.

## 2023-08-28 NOTE — Assessment & Plan Note (Signed)
 Chronic, stable. Continue amlodipine  5mg  daily along with a second dose of amlodipine  if needed. Check CMP, CBC, lipid panel today.

## 2023-08-29 LAB — HIV ANTIBODY (ROUTINE TESTING W REFLEX): HIV 1&2 Ab, 4th Generation: NONREACTIVE

## 2023-10-04 ENCOUNTER — Other Ambulatory Visit: Payer: Self-pay | Admitting: Nurse Practitioner

## 2023-10-04 NOTE — Telephone Encounter (Signed)
 Copied from CRM [id number]. Topic: Clinical - Medication Refill >> Oct 04, 2023  9:47 AM Thersia C wrote: Medication: tirzepatide  5 MG/0.5ML injection vial  Has the patient contacted their pharmacy? Yes (Agent: If no, request that the patient contact the pharmacy for the refill. If patient does not wish to contact the pharmacy document the reason why and proceed with request.) (Agent: If yes, when and what did the pharmacy advise?)  This is the patient's preferred pharmacy:  [facility] [place], [place] - [id number] [name] [address] Phone: [phone] Fax: [phone]  Is this the correct pharmacy for this prescription? Yes If no, delete pharmacy and type the correct one.   Has the prescription been filled recently? No  Is the patient out of the medication? Yes  Has the patient been seen for an appointment in the last year OR does the patient have an upcoming appointment? Yes  Can we respond through MyChart? Yes  Agent: Please be advised that Rx refills may take up to 3 business days. We ask that you follow-up with your pharmacy.

## 2023-10-09 NOTE — Telephone Encounter (Signed)
 Left message for patient to return call.

## 2023-10-10 MED ORDER — TIRZEPATIDE-WEIGHT MANAGEMENT 5 MG/0.5ML ~~LOC~~ SOLN: 5.0000 mg | SUBCUTANEOUS | 0 refills | Status: DC

## 2023-10-10 NOTE — Telephone Encounter (Signed)
 Per patient's request I left her a message that Rx was refilled and sent to her pharmacy.

## 2023-10-10 NOTE — Telephone Encounter (Signed)
 Copied from CRM [id number]. Topic: Clinical - Medication Question >> Oct 10, 2023  9:12 AM Kaitlyn Bennett wrote: Reason for CRM: Patient called in to return [name] call. But would like for her to leave a message due to having to work. Called regarding medication Zep bound dosage would like to have same dosage 5mg .

## 2023-10-22 ENCOUNTER — Other Ambulatory Visit (HOSPITAL_BASED_OUTPATIENT_CLINIC_OR_DEPARTMENT_OTHER): Payer: Self-pay

## 2023-10-25 DIAGNOSIS — Z1231 Encounter for screening mammogram for malignant neoplasm of breast: Secondary | ICD-10-CM | POA: Diagnosis not present

## 2023-10-25 LAB — HM MAMMOGRAPHY

## 2023-10-29 ENCOUNTER — Encounter: Payer: Self-pay | Admitting: Nurse Practitioner

## 2023-10-29 ENCOUNTER — Ambulatory Visit: Payer: Self-pay | Admitting: Nurse Practitioner

## 2023-11-04 ENCOUNTER — Other Ambulatory Visit: Payer: Self-pay | Admitting: Nurse Practitioner

## 2023-11-05 NOTE — Telephone Encounter (Signed)
 Requesting: ZEPBOUND  5 MG/0.5ML SUBCUTANEOUS SOLUTION  Last Visit: 08/28/2023 Next Visit: Visit date not found Last Refill: 10/10/2023  Please Advise
